# Patient Record
Sex: Female | Born: 1950 | Race: White | Hispanic: No | Marital: Married | Smoking: Never smoker
Health system: Southern US, Community
[De-identification: ages and names within clinical notes are randomized; demographics above are authoritative.]

## PROBLEM LIST (undated history)

## (undated) DIAGNOSIS — I10 Essential (primary) hypertension: Secondary | ICD-10-CM

## (undated) DIAGNOSIS — E7800 Pure hypercholesterolemia, unspecified: Secondary | ICD-10-CM

## (undated) DIAGNOSIS — E079 Disorder of thyroid, unspecified: Secondary | ICD-10-CM

## (undated) DIAGNOSIS — M1990 Unspecified osteoarthritis, unspecified site: Secondary | ICD-10-CM

## (undated) DIAGNOSIS — K589 Irritable bowel syndrome without diarrhea: Secondary | ICD-10-CM

## (undated) DIAGNOSIS — G4730 Sleep apnea, unspecified: Secondary | ICD-10-CM

## (undated) DIAGNOSIS — F419 Anxiety disorder, unspecified: Secondary | ICD-10-CM

## (undated) DIAGNOSIS — K219 Gastro-esophageal reflux disease without esophagitis: Secondary | ICD-10-CM

## (undated) HISTORY — DX: Sleep apnea, unspecified: G47.30

## (undated) HISTORY — PX: ABDOMINAL HYSTERECTOMY: SHX81

## (undated) HISTORY — PX: DILATION AND CURETTAGE OF UTERUS: SHX78

## (undated) HISTORY — PX: BACK SURGERY: SHX140

## (undated) HISTORY — PX: TONSILLECTOMY: SUR1361

## (undated) HISTORY — PX: BUNIONECTOMY: SHX129

---

## 2001-10-25 HISTORY — PX: NECK SURGERY: SHX720

## 2005-02-18 ENCOUNTER — Ambulatory Visit: Payer: Self-pay | Admitting: Internal Medicine

## 2005-05-25 ENCOUNTER — Ambulatory Visit: Payer: Self-pay

## 2005-07-07 ENCOUNTER — Ambulatory Visit: Payer: Self-pay | Admitting: Anesthesiology

## 2005-09-02 ENCOUNTER — Ambulatory Visit (HOSPITAL_COMMUNITY): Admission: RE | Admit: 2005-09-02 | Discharge: 2005-09-03 | Payer: Self-pay | Admitting: Neurosurgery

## 2006-09-08 ENCOUNTER — Ambulatory Visit: Payer: Self-pay | Admitting: Internal Medicine

## 2008-04-25 ENCOUNTER — Ambulatory Visit: Payer: Self-pay | Admitting: Internal Medicine

## 2008-05-01 ENCOUNTER — Ambulatory Visit: Payer: Self-pay | Admitting: Internal Medicine

## 2008-12-18 ENCOUNTER — Ambulatory Visit: Payer: Self-pay | Admitting: Internal Medicine

## 2009-11-25 ENCOUNTER — Ambulatory Visit: Payer: Self-pay | Admitting: Internal Medicine

## 2010-05-21 ENCOUNTER — Ambulatory Visit: Payer: Self-pay | Admitting: Gastroenterology

## 2010-07-22 ENCOUNTER — Ambulatory Visit: Payer: Self-pay | Admitting: Internal Medicine

## 2010-12-07 ENCOUNTER — Ambulatory Visit: Payer: Self-pay | Admitting: Internal Medicine

## 2011-05-27 ENCOUNTER — Ambulatory Visit: Payer: Self-pay | Admitting: Podiatry

## 2011-06-11 ENCOUNTER — Ambulatory Visit: Payer: Self-pay | Admitting: Podiatry

## 2012-03-16 ENCOUNTER — Ambulatory Visit: Payer: Self-pay | Admitting: Internal Medicine

## 2013-02-05 ENCOUNTER — Ambulatory Visit: Payer: Self-pay

## 2013-02-08 ENCOUNTER — Ambulatory Visit: Payer: Self-pay | Admitting: Podiatry

## 2013-03-20 ENCOUNTER — Ambulatory Visit: Payer: Self-pay | Admitting: Internal Medicine

## 2013-08-27 ENCOUNTER — Ambulatory Visit: Payer: Self-pay | Admitting: Unknown Physician Specialty

## 2014-01-04 ENCOUNTER — Ambulatory Visit: Payer: Self-pay | Admitting: Internal Medicine

## 2014-04-24 ENCOUNTER — Ambulatory Visit: Payer: Self-pay | Admitting: Internal Medicine

## 2014-05-06 ENCOUNTER — Ambulatory Visit: Payer: Self-pay | Admitting: Gastroenterology

## 2014-06-19 ENCOUNTER — Ambulatory Visit: Payer: Self-pay | Admitting: Gastroenterology

## 2015-01-17 ENCOUNTER — Ambulatory Visit: Payer: Self-pay | Admitting: Specialist

## 2015-03-19 ENCOUNTER — Other Ambulatory Visit: Payer: Self-pay | Admitting: Internal Medicine

## 2015-03-19 DIAGNOSIS — Z1231 Encounter for screening mammogram for malignant neoplasm of breast: Secondary | ICD-10-CM

## 2015-04-29 ENCOUNTER — Ambulatory Visit: Payer: Self-pay

## 2015-04-30 ENCOUNTER — Ambulatory Visit
Admission: RE | Admit: 2015-04-30 | Discharge: 2015-04-30 | Disposition: A | Discharge status: Home / Self Care | Payer: BC Managed Care – PPO | Source: Ambulatory Visit | Attending: Internal Medicine | Admitting: Internal Medicine

## 2015-04-30 DIAGNOSIS — Z1231 Encounter for screening mammogram for malignant neoplasm of breast: Secondary | ICD-10-CM | POA: Diagnosis present

## 2015-08-22 ENCOUNTER — Emergency Department
Admission: EM | Admit: 2015-08-22 | Discharge: 2015-08-22 | Disposition: A | Discharge status: Home / Self Care | Payer: BC Managed Care – PPO | Attending: Emergency Medicine | Admitting: Emergency Medicine

## 2015-08-22 ENCOUNTER — Emergency Department: Payer: BC Managed Care – PPO

## 2015-08-22 ENCOUNTER — Encounter: Payer: Self-pay | Admitting: Emergency Medicine

## 2015-08-22 DIAGNOSIS — K529 Noninfective gastroenteritis and colitis, unspecified: Secondary | ICD-10-CM

## 2015-08-22 DIAGNOSIS — I10 Essential (primary) hypertension: Secondary | ICD-10-CM | POA: Diagnosis not present

## 2015-08-22 DIAGNOSIS — R1030 Lower abdominal pain, unspecified: Secondary | ICD-10-CM | POA: Diagnosis present

## 2015-08-22 HISTORY — DX: Pure hypercholesterolemia, unspecified: E78.00

## 2015-08-22 HISTORY — DX: Essential (primary) hypertension: I10

## 2015-08-22 HISTORY — DX: Unspecified osteoarthritis, unspecified site: M19.90

## 2015-08-22 HISTORY — DX: Disorder of thyroid, unspecified: E07.9

## 2015-08-22 LAB — URINALYSIS COMPLETE WITH MICROSCOPIC (ARMC ONLY)
BILIRUBIN URINE: NEGATIVE
Glucose, UA: NEGATIVE mg/dL
Hgb urine dipstick: NEGATIVE
Ketones, ur: NEGATIVE mg/dL
Nitrite: NEGATIVE
PH: 5 (ref 5.0–8.0)
Protein, ur: 30 mg/dL — AB
SPECIFIC GRAVITY, URINE: 1.018 (ref 1.005–1.030)

## 2015-08-22 LAB — CBC WITH DIFFERENTIAL/PLATELET
BASOS ABS: 0.1 10*3/uL (ref 0–0.1)
Basophils Relative: 1 %
Eosinophils Absolute: 0.1 10*3/uL (ref 0–0.7)
Eosinophils Relative: 1 %
HEMATOCRIT: 43.5 % (ref 35.0–47.0)
Hemoglobin: 15 g/dL (ref 12.0–16.0)
LYMPHS PCT: 14 %
Lymphs Abs: 2 10*3/uL (ref 1.0–3.6)
MCH: 31.7 pg (ref 26.0–34.0)
MCHC: 34.5 g/dL (ref 32.0–36.0)
MCV: 91.8 fL (ref 80.0–100.0)
Monocytes Absolute: 0.9 10*3/uL (ref 0.2–0.9)
Monocytes Relative: 6 %
Neutro Abs: 11.2 10*3/uL — ABNORMAL HIGH (ref 1.4–6.5)
Neutrophils Relative %: 78 %
Platelets: 196 10*3/uL (ref 150–440)
RBC: 4.74 MIL/uL (ref 3.80–5.20)
RDW: 13.1 % (ref 11.5–14.5)
WBC: 14.4 10*3/uL — ABNORMAL HIGH (ref 3.6–11.0)

## 2015-08-22 LAB — COMPREHENSIVE METABOLIC PANEL
ALT: 35 U/L (ref 14–54)
ANION GAP: 11 (ref 5–15)
AST: 39 U/L (ref 15–41)
Albumin: 4.3 g/dL (ref 3.5–5.0)
Alkaline Phosphatase: 74 U/L (ref 38–126)
BUN: 16 mg/dL (ref 6–20)
CO2: 22 mmol/L (ref 22–32)
Calcium: 9.6 mg/dL (ref 8.9–10.3)
Chloride: 102 mmol/L (ref 101–111)
Creatinine, Ser: 0.75 mg/dL (ref 0.44–1.00)
GFR calc Af Amer: >60 mL/min (ref >60)
GFR calc non Af Amer: >60 mL/min (ref >60)
Glucose, Bld: 147 mg/dL — ABNORMAL HIGH (ref 65–99)
Potassium: 4.1 mmol/L (ref 3.5–5.1)
Sodium: 135 mmol/L (ref 135–145)
Total Bilirubin: 1.1 mg/dL (ref 0.3–1.2)
Total Protein: 7.8 g/dL (ref 6.5–8.1)

## 2015-08-22 LAB — LIPASE, BLOOD: Lipase: 25 U/L (ref 11–51)

## 2015-08-22 MED ORDER — CIPROFLOXACIN HCL 500 MG PO TABS
500.0000 mg | ORAL_TABLET | Freq: Once | ORAL | Status: AC
  Administered 2015-08-22: 500 mg via ORAL
  Filled 2015-08-22: qty 1

## 2015-08-22 MED ORDER — CIPROFLOXACIN HCL 500 MG PO TABS: 500.0000 mg | ORAL_TABLET | Freq: Two times a day (BID) | ORAL | Status: DC

## 2015-08-22 MED ORDER — METRONIDAZOLE 500 MG PO TABS: 500.0000 mg | ORAL_TABLET | Freq: Three times a day (TID) | ORAL | Status: AC

## 2015-08-22 MED ORDER — OXYCODONE-ACETAMINOPHEN 5-325 MG PO TABS: 1.0000 | ORAL_TABLET | Freq: Four times a day (QID) | ORAL | Status: DC | PRN

## 2015-08-22 MED ORDER — ONDANSETRON HCL 4 MG/2ML IJ SOLN
4.0000 mg | Freq: Once | INTRAMUSCULAR | Status: AC
  Administered 2015-08-22: 4 mg via INTRAVENOUS
  Filled 2015-08-22: qty 2

## 2015-08-22 MED ORDER — SODIUM CHLORIDE 0.9 % IV BOLUS (SEPSIS)
1000.0000 mL | Freq: Once | INTRAVENOUS | Status: AC
  Administered 2015-08-22: 1000 mL via INTRAVENOUS

## 2015-08-22 MED ORDER — IOHEXOL 240 MG/ML SOLN
25.0000 mL | Freq: Once | INTRAMUSCULAR | Status: AC | PRN
  Administered 2015-08-22: 25 mL via ORAL

## 2015-08-22 MED ORDER — MORPHINE SULFATE (PF) 4 MG/ML IV SOLN
4.0000 mg | Freq: Once | INTRAVENOUS | Status: AC
  Administered 2015-08-22: 4 mg via INTRAVENOUS
  Filled 2015-08-22: qty 1

## 2015-08-22 MED ORDER — IOHEXOL 300 MG/ML  SOLN
100.0000 mL | Freq: Once | INTRAMUSCULAR | Status: AC | PRN
  Administered 2015-08-22: 100 mL via INTRAVENOUS

## 2015-08-22 MED ORDER — METRONIDAZOLE 500 MG PO TABS
500.0000 mg | ORAL_TABLET | Freq: Once | ORAL | Status: AC
  Administered 2015-08-22: 500 mg via ORAL
  Filled 2015-08-22: qty 1

## 2015-08-22 NOTE — ED Provider Notes (Addendum)
[facility] Emergency Department Provider Note  ____________________________________________  Time seen: Approximately 9 PM  I have reviewed the triage vital signs and the nursing notes.   HISTORY  Chief Complaint Abdominal Pain and Rectal Bleeding    HPI Valerie Solis is a 64 y.o. female with a history of diverticulosis who is presenting today with lower abdominal cramping with multiple episodes of diarrhea. She says that about 4:30 this morning she began to have a "bubbling" in her stomach. She then began to have intermittent cramping abdominal pain. She says she has had 7-8 episodes of diarrhea the last 3 of which were completely blood. She describes the blood as bright red. Said that she had had some bleeding in the past but it was attributed to a hemorrhoid. However, it was not to the amount that it has been today.She denies any recent antibiotics or hospitalizations. Denies any chest pain or shortness of breath. Does admit to nausea but no vomiting. Said she took one Dulcolax 24 hours ago.   Past Medical History  Diagnosis Date  . Hypertension   . Thyroid disease   . Hypercholesterolemia   . Arthritis     There are no active problems to display for this patient.   Past Surgical History  Procedure Laterality Date  . Abdominal hysterectomy    . Back surgery      No current outpatient prescriptions on file.  Allergies Ace inhibitors and Sulfa antibiotics  Family History  Problem Relation Age of Onset  . Breast cancer Maternal Aunt [id number]    Social History Social History  Substance Use Topics  . Smoking status: Never Smoker   . Smokeless tobacco: None  . Alcohol Use: No    Review of Systems Constitutional: No fever/chills Eyes: No visual changes. ENT: No sore throat. Cardiovascular: Denies chest pain. Respiratory: Denies shortness of breath. Gastrointestinal: no vomiting.  No constipation. Genitourinary: Negative for  dysuria. Musculoskeletal: Negative for back pain. Skin: Negative for rash. Neurological: Negative for headaches, focal weakness or numbness.  10-point ROS otherwise negative.  ____________________________________________   PHYSICAL EXAM:  VITAL SIGNS: ED Triage Vitals  Enc Vitals Group     BP 08/22/15 1615 127/55 mmHg     Pulse Rate 08/22/15 1615 102     Resp 08/22/15 1615 20     Temp 08/22/15 1615 99.5 F (37.5 C)     Temp Source 08/22/15 1615 Oral     SpO2 08/22/15 1615 98 %     Weight 08/22/15 1615 217 lb (98.431 kg)     Height 08/22/15 1615 5\' 6"  (1.676 m)     Head Cir --      Peak Flow --      Pain Score 08/22/15 1616 6     Pain Loc --      Pain Edu? --      Excl. in GC? --     Constitutional: Alert and oriented. Well appearing and in no acute distress. Eyes: Conjunctivae are normal. PERRL. EOMI. Head: Atraumatic. Nose: No congestion/rhinnorhea. Mouth/Throat: Mucous membranes are moist.  Oropharynx non-erythematous. Neck: No stridor.   Cardiovascular: Normal rate, regular rhythm. Grossly normal heart sounds.  Good peripheral circulation. Respiratory: Normal respiratory effort.  No retractions. Lungs CTAB. Gastrointestinal: Soft with suprapubic and left lower quadrant tenderness to palpation. There is no rebound or guarding.. No distention. No abdominal bruits. No CVA tenderness. Rectal exam without any external hemorrhoids. Small amount of bright red blood streaks on my glove on internal  exam. Musculoskeletal: No lower extremity tenderness nor edema.  No joint effusions. Neurologic:  Normal speech and language. No gross focal neurologic deficits are appreciated. No gait instability. Skin:  Skin is warm, dry and intact. No rash noted. Psychiatric: Mood and affect are normal. Speech and behavior are normal.  ____________________________________________   LABS (all labs ordered are listed, but only abnormal results are displayed)  Labs Reviewed  COMPREHENSIVE  METABOLIC PANEL - Abnormal; Notable for the following:    Glucose, Bld 147 (*)    All other components within normal limits  URINALYSIS COMPLETEWITH MICROSCOPIC ([facility] ONLY) - Abnormal; Notable for the following:    Color, Urine YELLOW (*)    APPearance CLEAR (*)    Protein, ur 30 (*)    Leukocytes, UA TRACE (*)    Bacteria, UA RARE (*)    Squamous Epithelial / LPF 0-5 (*)    All other components within normal limits  CBC WITH DIFFERENTIAL/PLATELET - Abnormal; Notable for the following:    WBC 14.4 (*)    Neutro Abs 11.2 (*)    All other components within normal limits  LIPASE, BLOOD  CBC WITH DIFFERENTIAL/PLATELET   ____________________________________________  EKG   ____________________________________________  RADIOLOGY  Patient with uncomplicated colitis on her CAT scan her abdomen and pelvis. ____________________________________________   PROCEDURES   ____________________________________________   INITIAL IMPRESSION / ASSESSMENT AND PLAN / ED COURSE  Pertinent labs & imaging results that were available during my care of the patient were reviewed by me and considered in my medical decision making (see chart for details).  ----------------------------------------- 11:13 PM on 08/22/2015 -----------------------------------------  Patient with improved symptoms after morphine and Zofran. Discussed the CAT scan as well as lab findings with the patient. She again denies any antibiotics recently, any travel, camping. Unclear cause of her colitis at this time. However, will discharge with antibiotics and pain medications. Patient will follow up with her primary care doctor.knows to Return for any worsening or concerning symptoms. ____________________________________________   FINAL CLINICAL IMPRESSION(S) / ED DIAGNOSES  Acute colitis.    Myrna Blazer, MD 08/22/15 2313  Furthermore, the patient at the time of discharge is not had any further episodes  of diarrhea while in the treatment area.  Myrna Blazer, MD 08/22/15 [phone]

## 2015-08-22 NOTE — ED Notes (Signed)
Patient presents to the ED after multiple episodes of diarrhea, lower abdominal pain and most recently a couple episodes of stool with bright red blood.  Patient reports knowledge of a hemorrhoid.  Patient reports a history of diverticulitis.  Patient states she took dulcolax yesterday due to constipation.  Patient denies vomiting.  Patient ambulatory to triage without obvious distress.

## 2015-08-22 NOTE — ED Notes (Signed)
Pt dc to home w/ spouse. Pt ambulatory and a/o x4.

## 2015-08-22 NOTE — ED Notes (Signed)
Patient transported to CT 

## 2015-08-22 NOTE — Discharge Instructions (Signed)
°  Colitis °Colitis is inflammation of the colon. Colitis may last a short time (acute) or it may last a long time (chronic). °CAUSES °This condition may be caused by: °· Viruses. °· Bacteria. °· Reactions to medicine. °· Certain autoimmune diseases, such as Crohn disease or ulcerative colitis. °SYMPTOMS °Symptoms of this condition include: °· Diarrhea. °· Passing bloody or tarry stool. °· Pain. °· Fever. °· Vomiting. °· Tiredness (fatigue). °· Weight loss. °· Bloating. °· Sudden increase in abdominal pain. °· Having fewer bowel movements than usual. °DIAGNOSIS °This condition is diagnosed with a stool test or a blood test. You may also have other tests, including X-rays, a CT scan, or a colonoscopy. °TREATMENT °Treatment may include: °· Resting the bowel. This involves not eating or drinking for a period of time. °· Fluids that are given through an IV tube. °· Medicine for pain and diarrhea. °· Antibiotic medicines. °· Cortisone medicines. °· Surgery. °HOME CARE INSTRUCTIONS °Eating and Drinking °· Follow instructions from your health care provider about eating or drinking restrictions. °· Drink enough fluid to keep your urine clear or pale yellow. °· Work with a dietitian to determine which foods cause your condition to flare up. °· Avoid foods that cause flare-ups. °· Eat a well-balanced diet. °Medicines °· Take over-the-counter and prescription medicines only as told by your health care provider. °· If you were prescribed an antibiotic medicine, take it as told by your health care provider. Do not stop taking the antibiotic even if you start to feel better. °General Instructions °· Keep all follow-up visits as told by your health care provider. This is important. °SEEK MEDICAL CARE IF: °· Your symptoms do not go away. °· You develop new symptoms. °SEEK IMMEDIATE MEDICAL CARE IF: °· You have a fever that does not go away with treatment. °· You develop chills. °· You have extreme weakness, fainting, or  dehydration. °· You have repeated vomiting. °· You develop severe pain in your abdomen. °· You pass bloody or tarry stool. °  °This information is not intended to replace advice given to you by your health care provider. Make sure you discuss any questions you have with your health care provider. °  °Document Released: 11/18/2004 Document Revised: 07/02/2015 Document Reviewed: 02/03/2015 °Elsevier Interactive Patient Education ©2016 [employer] ° °

## 2016-03-26 ENCOUNTER — Other Ambulatory Visit
Admission: RE | Admit: 2016-03-26 | Discharge: 2016-03-26 | Disposition: A | Discharge status: Home / Self Care | Payer: BC Managed Care – PPO | Source: Ambulatory Visit | Attending: Unknown Physician Specialty | Admitting: Unknown Physician Specialty

## 2016-03-26 DIAGNOSIS — M25562 Pain in left knee: Secondary | ICD-10-CM | POA: Insufficient documentation

## 2016-03-26 LAB — SYNOVIAL CELL COUNT + DIFF, W/ CRYSTALS
Crystals, Fluid: NONE SEEN
EOSINOPHILS-SYNOVIAL: 0 %
LYMPHOCYTES-SYNOVIAL FLD: 3 %
Monocyte-Macrophage-Synovial Fluid: 5 %
NEUTROPHIL, SYNOVIAL: 92 %
OTHER CELLS-SYN: 0
WBC, SYNOVIAL: 21527 /mm3 — AB (ref 0–200)

## 2016-03-30 ENCOUNTER — Other Ambulatory Visit: Payer: Self-pay | Admitting: Internal Medicine

## 2016-03-30 DIAGNOSIS — Z1231 Encounter for screening mammogram for malignant neoplasm of breast: Secondary | ICD-10-CM

## 2016-03-31 LAB — BODY FLUID CULTURE: CULTURE: NO GROWTH

## 2016-04-30 ENCOUNTER — Ambulatory Visit: Payer: BC Managed Care – PPO

## 2016-05-27 ENCOUNTER — Ambulatory Visit: Payer: BC Managed Care – PPO

## 2016-06-09 ENCOUNTER — Other Ambulatory Visit: Payer: Self-pay | Admitting: Internal Medicine

## 2016-06-09 ENCOUNTER — Ambulatory Visit
Admission: RE | Admit: 2016-06-09 | Discharge: 2016-06-09 | Disposition: A | Discharge status: Home / Self Care | Payer: BC Managed Care – PPO | Source: Ambulatory Visit | Attending: Internal Medicine | Admitting: Internal Medicine

## 2016-06-09 DIAGNOSIS — Z1231 Encounter for screening mammogram for malignant neoplasm of breast: Secondary | ICD-10-CM | POA: Diagnosis present

## 2016-10-21 ENCOUNTER — Encounter: Payer: Self-pay | Admitting: *Deleted

## 2016-10-21 ENCOUNTER — Encounter: Payer: BC Managed Care – PPO | Attending: Internal Medicine | Admitting: *Deleted

## 2016-10-21 VITALS — BP 110/68 | Ht 66.0 in | Wt 222.6 lb

## 2016-10-21 DIAGNOSIS — Z713 Dietary counseling and surveillance: Secondary | ICD-10-CM | POA: Diagnosis not present

## 2016-10-21 DIAGNOSIS — E119 Type 2 diabetes mellitus without complications: Secondary | ICD-10-CM

## 2016-10-21 NOTE — Progress Notes (Signed)
Diabetes Self-Management Education  Visit Type: First/Initial  Appt. Start Time: 1545 Appt. End Time: 1710  10/21/2016  [name], identified by name and date of birth, is a 65 y.o. female with a diagnosis of Diabetes: Type 2.   ASSESSMENT  Blood pressure 110/68, height 5\' 6"  (1.676 m), weight 222 lb 9.6 oz (101 kg). Body mass index is 35.93 kg/m.      Diabetes Self-Management Education - 10/21/16 1735      Visit Information   Visit Type First/Initial     Initial Visit   Diabetes Type Type 2   Are you currently following a meal plan? No   Are you taking your medications as prescribed? Yes   Date Diagnosed 3 weeks ago     Health Coping   How would you rate your overall health? Good     Psychosocial Assessment   Patient Belief/Attitude about Diabetes Motivated to manage diabetes  "sad"   Self-care barriers None   Self-management support Doctor's office;Family   Patient Concerns Nutrition/Meal planning;Medication;Monitoring;Healthy Lifestyle;Glycemic Control;Weight Control   Special Needs None   Preferred Learning Style Auditory;Visual;Hands on   Learning Readiness Ready   How often do you need to have someone help you when you read instructions, pamphlets, or other written materials from your doctor or pharmacy? 1 - Never   What is the last grade level you completed in school? Masters     Pre-Education Assessment   Patient understands the diabetes disease and treatment process. Needs Instruction   Patient understands incorporating nutritional management into lifestyle. Needs Instruction   Patient undertands incorporating physical activity into lifestyle. Needs Review   Patient understands using medications safely. Needs Instruction   Patient understands monitoring blood glucose, interpreting and using results Needs Instruction   Patient understands prevention, detection, and treatment of acute complications. Needs Instruction   Patient understands  prevention, detection, and treatment of chronic complications. Needs Instruction   Patient understands how to develop strategies to address psychosocial issues. Needs Instruction   Patient understands how to develop strategies to promote health/change behavior. Needs Instruction     Complications   Last HgB A1C per patient/outside source 6.9 %  09/20/16   How often do you check your blood sugar? 0 times/day (not testing)  Provided Contour Next One meter and instructed on use. BG upon return demonstration was 176 mg/dL at 1:614:55 pm - 3 hrs pp. (Had cereal and milk)   Have you had a dilated eye exam in the past 12 months? No   Have you had a dental exam in the past 12 months? Yes   Are you checking your feet? No     Dietary Intake   Breakfast breakfast burrito and 2 chocolate milks from [employer]; cereal and milk; bagel   Snack (morning) wheat thins   Lunch sandwich; lunchables with fruit, cheese   Snack (afternoon) lite yogurt   Dinner grilled [place]key burger, pork chop, flank steak with sweet potato, peas, green beans, corn, pasta, rice   Beverage(s) juice, power aid zero, diet soda     Exercise   Exercise Type Light (walking / raking leaves)   How many days per week to you exercise? 7   How many minutes per day do you exercise? 20   Total minutes per week of exercise 140     Patient Education   Previous Diabetes Education No   Disease state  Definition of diabetes, type 1 and 2, and the diagnosis of diabetes;Factors that contribute to  the development of diabetes   Nutrition management  Role of diet in the treatment of diabetes and the relationship between the three main macronutrients and blood glucose level;Carbohydrate counting;Reviewed blood glucose goals for pre and post meals and how to evaluate the patients' food intake on their blood glucose level.   Physical activity and exercise  Role of exercise on diabetes management, blood pressure control and cardiac health.   Medications  Reviewed patients medication for diabetes, action, purpose, timing of dose and side effects.   Monitoring Taught/evaluated SMBG meter.;Purpose and frequency of SMBG.;Taught/discussed recording of test results and interpretation of SMBG.;Identified appropriate SMBG and/or A1C goals.   Chronic complications Relationship between chronic complications and blood glucose control;Retinopathy and reason for yearly dilated eye exams   Psychosocial adjustment Identified and addressed patients feelings and concerns about diabetes     Individualized Goals (developed by patient)   Reducing Risk Improve blood sugars Decrease medications Prevent diabetes complications Lose weight Lead a healthier lifestyle     Outcomes   Expected Outcomes Demonstrated interest in learning. Expect positive outcomes   Future DMSE 2 wks      Individualized Plan for Diabetes Self-Management Training:   Learning Objective:  Patient will have a greater understanding of diabetes self-management. Patient education plan is to attend individual and/or group sessions per assessed needs and concerns.   Plan:   Patient Instructions  Check blood sugars 1 x day before breakfast or 2 hrs after supper every day Exercise: Continue walking for  20  minutes  7 days a week and gradually increase to 150 minutes/week Eat 3 meals day, 2  snacks a day Space meals 4-6 hours apart Avoid sugar sweetened drinks (juices) Make an eye doctor appointment Bring blood sugar records to the next class Call your doctor for a prescription for:  1. Meter strips (type) Contour Next   checking  1     times per day  2. Lancets (type) Contour Microlet checking  1     times per day  Expected Outcomes:  Demonstrated interest in learning. Expect positive outcomes  Education material provided:  General Meal Planning Guidelines Simple Meal Plan Meter - Bayer Contour Next One  If problems or questions, patient to contact team via:  Sharion SettlerSheila Cooper Stamp, RN,  CCM, CDE [phone]  Future DSME appointment: 2 wks  Monday November 01, 2016 for Diabetes Class 1

## 2016-10-21 NOTE — Patient Instructions (Signed)
Check blood sugars 1 x day before breakfast or 2 hrs after supper every day  Exercise: Continue walking for  20  minutes  7 days a week and gradually increase to 150 minutes/week  Eat 3 meals day, 2  snacks a day Space meals 4-6 hours apart Avoid sugar sweetened drinks (juices)  Make an eye doctor appointment  Bring blood sugar records to the next class  Call your doctor for a prescription for:  1. Meter strips (type) Contour Next   checking  1     times per day  2. Lancets (type) Contour Microlet checking  1     times per day  Return for classes on:

## 2016-11-01 ENCOUNTER — Ambulatory Visit: Payer: BC Managed Care – PPO

## 2016-11-08 ENCOUNTER — Ambulatory Visit: Payer: BC Managed Care – PPO

## 2016-11-08 ENCOUNTER — Encounter: Payer: Self-pay | Admitting: *Deleted

## 2016-11-08 NOTE — Progress Notes (Signed)
Pt came by at 8:00 am today for meter instruction. She had to obtain a new meter due to insurance. Instructed her on One Touch Ultra 2 meter. Blood sugar upon return demonstration was 225 mg/dL at 9:608:15 am - fasting. Pt reported that Metformin caused her stomach to be upset (history of IBS) and she is not taking. She reports that she sent Dr Graciela HusbandsKlein a note and is waiting to hear back from his office.

## 2016-11-15 ENCOUNTER — Ambulatory Visit: Payer: BC Managed Care – PPO

## 2016-11-29 ENCOUNTER — Ambulatory Visit: Payer: BC Managed Care – PPO

## 2016-11-30 ENCOUNTER — Telehealth: Payer: Self-pay | Admitting: Dietician

## 2016-11-30 NOTE — Telephone Encounter (Signed)
Returned message from patient that she was unable to attend class 1 yesterday 11/29/16 due to being ill with flu. Left message for her to call back and confirm if she would like to come to next evening series, beginning 12/27/16.

## 2016-12-03 ENCOUNTER — Telehealth: Payer: Self-pay | Admitting: Dietician

## 2016-12-03 NOTE — Telephone Encounter (Signed)
Returned voicemail message from patient that she would like to return for class 1 on 12/27/16 and complete that series. Left a message to confirm that she will be added to that class.

## 2016-12-06 ENCOUNTER — Ambulatory Visit: Payer: BC Managed Care – PPO

## 2016-12-13 ENCOUNTER — Ambulatory Visit: Payer: BC Managed Care – PPO

## 2016-12-27 ENCOUNTER — Encounter: Payer: BC Managed Care – PPO | Attending: Internal Medicine | Admitting: Dietician

## 2016-12-27 ENCOUNTER — Encounter: Payer: Self-pay | Admitting: Dietician

## 2016-12-27 VITALS — Ht 66.0 in | Wt 220.3 lb

## 2016-12-27 DIAGNOSIS — E119 Type 2 diabetes mellitus without complications: Secondary | ICD-10-CM | POA: Insufficient documentation

## 2016-12-27 DIAGNOSIS — Z713 Dietary counseling and surveillance: Secondary | ICD-10-CM | POA: Insufficient documentation

## 2016-12-27 NOTE — Progress Notes (Signed)

## 2017-01-17 ENCOUNTER — Ambulatory Visit: Payer: BC Managed Care – PPO

## 2017-01-18 ENCOUNTER — Telehealth: Payer: Self-pay | Admitting: *Deleted

## 2017-01-18 NOTE — Telephone Encounter (Signed)
Patient left a message on main number last night that she needed to reschedule Classes 2 and 3. Called and left her a message of upcoming dates in April and May. Asked her to call back and confirm.

## 2017-03-03 ENCOUNTER — Encounter: Payer: Self-pay | Admitting: *Deleted

## 2017-05-18 ENCOUNTER — Other Ambulatory Visit: Payer: Self-pay | Admitting: Internal Medicine

## 2017-05-18 DIAGNOSIS — Z1231 Encounter for screening mammogram for malignant neoplasm of breast: Secondary | ICD-10-CM

## 2017-06-15 ENCOUNTER — Inpatient Hospital Stay: Admission: RE | Admit: 2017-06-15 | Payer: BC Managed Care – PPO | Source: Ambulatory Visit

## 2017-07-01 ENCOUNTER — Other Ambulatory Visit: Payer: Self-pay | Admitting: Surgery

## 2017-07-01 DIAGNOSIS — M1712 Unilateral primary osteoarthritis, left knee: Secondary | ICD-10-CM

## 2017-07-08 ENCOUNTER — Ambulatory Visit: Payer: BC Managed Care – PPO

## 2017-07-11 ENCOUNTER — Ambulatory Visit
Admission: RE | Admit: 2017-07-11 | Discharge: 2017-07-11 | Disposition: A | Discharge status: Home / Self Care | Payer: BC Managed Care – PPO | Source: Ambulatory Visit | Attending: Surgery | Admitting: Surgery

## 2017-07-11 DIAGNOSIS — X58XXXA Exposure to other specified factors, initial encounter: Secondary | ICD-10-CM | POA: Insufficient documentation

## 2017-07-11 DIAGNOSIS — S83242A Other tear of medial meniscus, current injury, left knee, initial encounter: Secondary | ICD-10-CM | POA: Insufficient documentation

## 2017-07-11 DIAGNOSIS — M1712 Unilateral primary osteoarthritis, left knee: Secondary | ICD-10-CM

## 2017-07-27 ENCOUNTER — Ambulatory Visit: Payer: Medicare Other

## 2017-07-27 ENCOUNTER — Other Ambulatory Visit: Payer: Self-pay | Admitting: Surgery

## 2017-07-27 ENCOUNTER — Ambulatory Visit
Admission: RE | Admit: 2017-07-27 | Discharge: 2017-07-27 | Disposition: A | Discharge status: Home / Self Care | Payer: Medicare Other | Source: Ambulatory Visit | Attending: Surgery | Admitting: Surgery

## 2017-07-27 ENCOUNTER — Encounter
Admission: RE | Admit: 2017-07-27 | Discharge: 2017-07-27 | Disposition: A | Discharge status: Home / Self Care | Payer: Medicare Other | Source: Ambulatory Visit | Attending: Surgery | Admitting: Surgery

## 2017-07-27 DIAGNOSIS — Z01818 Encounter for other preprocedural examination: Secondary | ICD-10-CM

## 2017-07-27 DIAGNOSIS — F329 Major depressive disorder, single episode, unspecified: Secondary | ICD-10-CM | POA: Diagnosis not present

## 2017-07-27 DIAGNOSIS — I10 Essential (primary) hypertension: Secondary | ICD-10-CM | POA: Insufficient documentation

## 2017-07-27 DIAGNOSIS — M1712 Unilateral primary osteoarthritis, left knee: Secondary | ICD-10-CM | POA: Diagnosis not present

## 2017-07-27 DIAGNOSIS — Z7984 Long term (current) use of oral hypoglycemic drugs: Secondary | ICD-10-CM | POA: Diagnosis not present

## 2017-07-27 DIAGNOSIS — F419 Anxiety disorder, unspecified: Secondary | ICD-10-CM | POA: Diagnosis not present

## 2017-07-27 DIAGNOSIS — G4730 Sleep apnea, unspecified: Secondary | ICD-10-CM | POA: Diagnosis not present

## 2017-07-27 DIAGNOSIS — Z79899 Other long term (current) drug therapy: Secondary | ICD-10-CM | POA: Diagnosis not present

## 2017-07-27 DIAGNOSIS — K219 Gastro-esophageal reflux disease without esophagitis: Secondary | ICD-10-CM | POA: Diagnosis not present

## 2017-07-27 DIAGNOSIS — E785 Hyperlipidemia, unspecified: Secondary | ICD-10-CM | POA: Diagnosis not present

## 2017-07-27 HISTORY — DX: Irritable bowel syndrome, unspecified: K58.9

## 2017-07-27 HISTORY — DX: Anxiety disorder, unspecified: F41.9

## 2017-07-27 HISTORY — DX: Gastro-esophageal reflux disease without esophagitis: K21.9

## 2017-07-27 LAB — TYPE AND SCREEN
ABO/RH(D): A NEG
Antibody Screen: NEGATIVE

## 2017-07-27 LAB — BASIC METABOLIC PANEL
ANION GAP: 9 (ref 5–15)
BUN: 14 mg/dL (ref 6–20)
CALCIUM: 9.4 mg/dL (ref 8.9–10.3)
CO2: 25 mmol/L (ref 22–32)
Chloride: 104 mmol/L (ref 101–111)
Creatinine, Ser: 0.66 mg/dL (ref 0.44–1.00)
GFR calc Af Amer: >60 mL/min (ref >60)
GFR calc non Af Amer: >60 mL/min (ref >60)
GLUCOSE: 121 mg/dL — AB (ref 65–99)
Potassium: 4 mmol/L (ref 3.5–5.1)
Sodium: 138 mmol/L (ref 135–145)

## 2017-07-27 LAB — CBC
HEMATOCRIT: 39.5 % (ref 35.0–47.0)
Hemoglobin: 13.9 g/dL (ref 12.0–16.0)
MCH: 32.1 pg (ref 26.0–34.0)
MCHC: 35.2 g/dL (ref 32.0–36.0)
MCV: 91.2 fL (ref 80.0–100.0)
Platelets: 165 10*3/uL (ref 150–440)
RBC: 4.33 MIL/uL (ref 3.80–5.20)
RDW: 12.7 % (ref 11.5–14.5)
WBC: 5.4 10*3/uL (ref 3.6–11.0)

## 2017-07-27 LAB — URINALYSIS, COMPLETE (UACMP) WITH MICROSCOPIC
Bacteria, UA: NONE SEEN
Bilirubin Urine: NEGATIVE
GLUCOSE, UA: NEGATIVE mg/dL
Hgb urine dipstick: NEGATIVE
Ketones, ur: NEGATIVE mg/dL
Nitrite: NEGATIVE
PH: 6 (ref 5.0–8.0)
PROTEIN: NEGATIVE mg/dL
Specific Gravity, Urine: 1.006 (ref 1.005–1.030)

## 2017-07-27 LAB — PROTIME-INR
INR: 0.99
Prothrombin Time: 13 seconds (ref 11.4–15.2)

## 2017-07-27 LAB — SURGICAL PCR SCREEN
MRSA, PCR: POSITIVE — AB
Staphylococcus aureus: POSITIVE — AB

## 2017-07-27 NOTE — Patient Instructions (Signed)
Your procedure is scheduled on: August 11, 2017 (THURSDAY ) Report to Same Day Surgery 2nd floor medical mall (Medical Mall Entrance-take elevator on left to 2nd floor.  Check in with surgery information desk.) To find out your arrival time please call [phone] between 1PM - 3PM on August 10, 2017 [facility] )    Remember: Instructions that are not followed completely may result in serious medical risk, up to and including death, or upon the discretion of your surgeon and anesthesiologist your surgery may need to be rescheduled.    _x___ 1. Do not eat food after midnight the night before your procedure. You may drink clear liquids up to 2 hours before you are scheduled to arrive at the hospital for your procedure.  Do not drink clear liquids within 2 hours of your scheduled arrival to the hospital.  Clear liquids include  --Water or Apple juice without pulp  --Clear carbohydrate beverage such as ClearFast or Gatorade  --Black Coffee or Clear Tea (No milk, no creamers, do not add anything to                  the coffee or Tea Type 1 and type 2 diabetics should only drink water.  No gum chewing or hard candies.     __x__ 2. No Alcohol for 24 hours before or after surgery.   __x__3. No Smoking for 24 prior to surgery.   ____  4. Bring all medications with you on the day of surgery if instructed.    __x__ 5. Notify your doctor if there is any change in your medical condition     (cold, fever, infections).     Do not wear jewelry, make-up, hairpins, clips or nail polish.  Do not wear lotions, powders, or perfumes.  Do not shave 48 hours prior to surgery. Men may shave face and neck.  Do not bring valuables to the hospital.    [facility] is not responsible for any belongings or valuables.               Contacts, dentures or bridgework may not be worn into surgery.  Leave your suitcase in the car. After surgery it may be brought to your room.  For patients admitted to the  hospital, discharge time is determined by your  treatment team                      Patients discharged the day of surgery will not be allowed to drive home.  You will need someone to drive you home and stay with you the night of your procedure.    Please read over the following fact sheets that you were given:   [facility] Preparing for Surgery and or MRSA Information   TAKE THE FOLLOWING MEDICATIONS WITH A SIP OF WATER THE MORNING OF SURGERY :  1. CYMBALTA  2. FAMOTIDINE  3. LEVOTHYROXINE   ____Fleets enema or Magnesium Citrate as directed.   _x___ Use CHG Soap or sage wipes as directed on instruction sheet   _X___ Use inhalers on the day of surgery and bring to hospital day of surgery (USE ALBUTEROL AND SYMBICORT INHALERS THE MORNING OF SURGERY AND BRING TO HOSPITAL )  ____ Stop Metformin and Janumet 2 days prior to surgery.    ____ Take 1/2 of usual insulin dose the night before surgery and none on the morning surgery.      _x___ Follow recommendations from Cardiologist, Pulmonologist or PCP regarding  stopping Aspirin, Coumadin, Plavix ,Eliquis, Effient, or Pradaxa, and Pletal.  X____Stop Anti-inflammatories such as Advil, Aleve, Ibuprofen, Motrin, Naproxen, Naprosyn, Goodies powders or aspirin products. OK to take Tylenol                            _x___ Stop supplements until after surgery.  But may continue Vitamin D, Vitamin B and multivitamin (STOP FISH OIL AND CO Q 10 NOW )        __X__ Bring C-Pap to the hospital.

## 2017-07-27 NOTE — Pre-Procedure Instructions (Signed)
Positive   MRSA/STAPH  results faxed to Dr. Joice Lofts office.

## 2017-07-28 ENCOUNTER — Ambulatory Visit
Admission: RE | Admit: 2017-07-28 | Discharge: 2017-07-28 | Disposition: A | Discharge status: Home / Self Care | Payer: Medicare Other | Source: Ambulatory Visit | Attending: Surgery | Admitting: Surgery

## 2017-07-28 ENCOUNTER — Other Ambulatory Visit: Payer: Self-pay | Admitting: Surgery

## 2017-07-28 DIAGNOSIS — Z01818 Encounter for other preprocedural examination: Secondary | ICD-10-CM | POA: Diagnosis present

## 2017-07-28 DIAGNOSIS — Z888 Allergy status to other drugs, medicaments and biological substances status: Secondary | ICD-10-CM | POA: Insufficient documentation

## 2017-07-28 DIAGNOSIS — Z882 Allergy status to sulfonamides status: Secondary | ICD-10-CM | POA: Diagnosis not present

## 2017-07-28 DIAGNOSIS — M1712 Unilateral primary osteoarthritis, left knee: Secondary | ICD-10-CM | POA: Insufficient documentation

## 2017-07-28 DIAGNOSIS — I700 Atherosclerosis of aorta: Secondary | ICD-10-CM | POA: Insufficient documentation

## 2017-07-28 DIAGNOSIS — I10 Essential (primary) hypertension: Secondary | ICD-10-CM

## 2017-07-28 LAB — URINE CULTURE

## 2017-07-29 NOTE — Pre-Procedure Instructions (Signed)
Urine Culture results sent to Dr. Joice Lofts for review.

## 2017-08-09 ENCOUNTER — Ambulatory Visit
Admission: RE | Admit: 2017-08-09 | Discharge: 2017-08-09 | Disposition: A | Discharge status: Home / Self Care | Payer: Medicare Other | Source: Ambulatory Visit | Attending: Internal Medicine | Admitting: Internal Medicine

## 2017-08-09 DIAGNOSIS — Z1231 Encounter for screening mammogram for malignant neoplasm of breast: Secondary | ICD-10-CM | POA: Insufficient documentation

## 2017-08-09 DIAGNOSIS — M1712 Unilateral primary osteoarthritis, left knee: Secondary | ICD-10-CM | POA: Diagnosis present

## 2017-08-10 MED ORDER — VANCOMYCIN HCL IN DEXTROSE 1-5 GM/200ML-% IV SOLN
1000.0000 mg | Freq: Once | INTRAVENOUS | Status: AC
  Administered 2017-08-11: 1000 mg via INTRAVENOUS

## 2017-08-11 ENCOUNTER — Inpatient Hospital Stay: Payer: Medicare Other | Admitting: Anesthesiology

## 2017-08-11 ENCOUNTER — Inpatient Hospital Stay
Admission: RE | Admit: 2017-08-11 | Discharge: 2017-08-15 | DRG: 469 | Disposition: A | Discharge status: Home Health | Payer: Medicare Other | Source: Ambulatory Visit | Attending: Internal Medicine | Admitting: Internal Medicine

## 2017-08-11 ENCOUNTER — Encounter
Admission: RE | Disposition: A | Discharge status: Home Health | Payer: Self-pay | Source: Ambulatory Visit | Attending: Surgery

## 2017-08-11 ENCOUNTER — Encounter: Payer: Self-pay | Admitting: Anesthesiology

## 2017-08-11 ENCOUNTER — Inpatient Hospital Stay: Payer: Medicare Other

## 2017-08-11 DIAGNOSIS — Z823 Family history of stroke: Secondary | ICD-10-CM

## 2017-08-11 DIAGNOSIS — I10 Essential (primary) hypertension: Secondary | ICD-10-CM | POA: Diagnosis present

## 2017-08-11 DIAGNOSIS — K219 Gastro-esophageal reflux disease without esophagitis: Secondary | ICD-10-CM | POA: Diagnosis present

## 2017-08-11 DIAGNOSIS — G4733 Obstructive sleep apnea (adult) (pediatric): Secondary | ICD-10-CM | POA: Diagnosis present

## 2017-08-11 DIAGNOSIS — J9601 Acute respiratory failure with hypoxia: Secondary | ICD-10-CM | POA: Diagnosis not present

## 2017-08-11 DIAGNOSIS — Z6835 Body mass index (BMI) 35.0-35.9, adult: Secondary | ICD-10-CM | POA: Diagnosis not present

## 2017-08-11 DIAGNOSIS — M1712 Unilateral primary osteoarthritis, left knee: Principal | ICD-10-CM | POA: Diagnosis present

## 2017-08-11 DIAGNOSIS — Z7951 Long term (current) use of inhaled steroids: Secondary | ICD-10-CM | POA: Diagnosis not present

## 2017-08-11 DIAGNOSIS — Z1231 Encounter for screening mammogram for malignant neoplasm of breast: Secondary | ICD-10-CM

## 2017-08-11 DIAGNOSIS — J45909 Unspecified asthma, uncomplicated: Secondary | ICD-10-CM | POA: Diagnosis present

## 2017-08-11 DIAGNOSIS — Z79899 Other long term (current) drug therapy: Secondary | ICD-10-CM

## 2017-08-11 DIAGNOSIS — Z90710 Acquired absence of both cervix and uterus: Secondary | ICD-10-CM | POA: Diagnosis not present

## 2017-08-11 DIAGNOSIS — K589 Irritable bowel syndrome without diarrhea: Secondary | ICD-10-CM | POA: Diagnosis present

## 2017-08-11 DIAGNOSIS — F329 Major depressive disorder, single episode, unspecified: Secondary | ICD-10-CM | POA: Diagnosis present

## 2017-08-11 DIAGNOSIS — Z833 Family history of diabetes mellitus: Secondary | ICD-10-CM

## 2017-08-11 DIAGNOSIS — Z96652 Presence of left artificial knee joint: Secondary | ICD-10-CM

## 2017-08-11 DIAGNOSIS — J209 Acute bronchitis, unspecified: Secondary | ICD-10-CM | POA: Diagnosis not present

## 2017-08-11 DIAGNOSIS — E669 Obesity, unspecified: Secondary | ICD-10-CM | POA: Diagnosis present

## 2017-08-11 DIAGNOSIS — E039 Hypothyroidism, unspecified: Secondary | ICD-10-CM | POA: Diagnosis present

## 2017-08-11 DIAGNOSIS — E119 Type 2 diabetes mellitus without complications: Secondary | ICD-10-CM | POA: Diagnosis present

## 2017-08-11 DIAGNOSIS — Z8249 Family history of ischemic heart disease and other diseases of the circulatory system: Secondary | ICD-10-CM | POA: Diagnosis not present

## 2017-08-11 DIAGNOSIS — E7800 Pure hypercholesterolemia, unspecified: Secondary | ICD-10-CM | POA: Diagnosis present

## 2017-08-11 DIAGNOSIS — Z7984 Long term (current) use of oral hypoglycemic drugs: Secondary | ICD-10-CM | POA: Diagnosis not present

## 2017-08-11 DIAGNOSIS — J189 Pneumonia, unspecified organism: Secondary | ICD-10-CM | POA: Diagnosis not present

## 2017-08-11 DIAGNOSIS — R7981 Abnormal blood-gas level: Secondary | ICD-10-CM

## 2017-08-11 DIAGNOSIS — Z79890 Hormone replacement therapy (postmenopausal): Secondary | ICD-10-CM | POA: Diagnosis not present

## 2017-08-11 DIAGNOSIS — E785 Hyperlipidemia, unspecified: Secondary | ICD-10-CM | POA: Diagnosis present

## 2017-08-11 HISTORY — PX: TOTAL KNEE ARTHROPLASTY: SHX125

## 2017-08-11 LAB — TYPE AND SCREEN
ABO/RH(D): A NEG
ANTIBODY SCREEN: NEGATIVE

## 2017-08-11 LAB — GLUCOSE, CAPILLARY
GLUCOSE-CAPILLARY: 135 mg/dL — AB (ref 65–99)
Glucose-Capillary: 105 mg/dL — ABNORMAL HIGH (ref 65–99)

## 2017-08-11 SURGERY — ARTHROPLASTY, KNEE, TOTAL
Anesthesia: Spinal | Laterality: Left

## 2017-08-11 MED ORDER — VITAMIN B-12 1000 MCG PO TABS
1000.0000 ug | ORAL_TABLET | Freq: Every day | ORAL | Status: DC
  Administered 2017-08-12 – 2017-08-15 (×4): 1000 ug via ORAL
  Filled 2017-08-11 (×4): qty 1

## 2017-08-11 MED ORDER — HYOSCYAMINE SULFATE 0.125 MG SL SUBL
0.1250 mg | SUBLINGUAL_TABLET | Freq: Four times a day (QID) | SUBLINGUAL | Status: DC | PRN
  Filled 2017-08-11: qty 1

## 2017-08-11 MED ORDER — CYCLOBENZAPRINE HCL 10 MG PO TABS: 10.0000 mg | ORAL_TABLET | Freq: Two times a day (BID) | ORAL | Status: DC | PRN

## 2017-08-11 MED ORDER — SODIUM CHLORIDE 0.9 % IV SOLN
INTRAVENOUS | Status: DC | PRN
  Administered 2017-08-11: 60 mL

## 2017-08-11 MED ORDER — ALBUTEROL SULFATE (2.5 MG/3ML) 0.083% IN NEBU
2.5000 mg | INHALATION_SOLUTION | RESPIRATORY_TRACT | Status: DC | PRN
  Administered 2017-08-13: 2.5 mg via RESPIRATORY_TRACT
  Filled 2017-08-11: qty 3

## 2017-08-11 MED ORDER — HYDROMORPHONE HCL 1 MG/ML IJ SOLN: 1.0000 mg | INTRAMUSCULAR | Status: DC | PRN

## 2017-08-11 MED ORDER — SODIUM CHLORIDE 0.9 % IJ SOLN
INTRAMUSCULAR | Status: DC | PRN
  Administered 2017-08-11: 40 mL via INTRAVENOUS

## 2017-08-11 MED ORDER — DIPHENHYDRAMINE HCL 12.5 MG/5ML PO ELIX: 12.5000 mg | ORAL_SOLUTION | ORAL | Status: DC | PRN

## 2017-08-11 MED ORDER — PROPOFOL 10 MG/ML IV BOLUS
INTRAVENOUS | Status: AC
  Filled 2017-08-11: qty 60

## 2017-08-11 MED ORDER — BUPIVACAINE-EPINEPHRINE (PF) 0.5% -1:200000 IJ SOLN
INTRAMUSCULAR | Status: AC
  Filled 2017-08-11: qty 30

## 2017-08-11 MED ORDER — KETOROLAC TROMETHAMINE 30 MG/ML IJ SOLN
INTRAMUSCULAR | Status: AC
  Administered 2017-08-11: 30 mg via INTRAVENOUS
  Filled 2017-08-11: qty 1

## 2017-08-11 MED ORDER — POTASSIUM CHLORIDE IN NACL 20-0.9 MEQ/L-% IV SOLN
INTRAVENOUS | Status: DC
  Administered 2017-08-11 – 2017-08-15 (×2): via INTRAVENOUS
  Filled 2017-08-11 (×9): qty 1000

## 2017-08-11 MED ORDER — NEOMYCIN-POLYMYXIN B GU 40-200000 IR SOLN
Status: DC | PRN
  Administered 2017-08-11: 14 mL

## 2017-08-11 MED ORDER — TRANEXAMIC ACID 1000 MG/10ML IV SOLN
INTRAVENOUS | Status: DC | PRN
  Administered 2017-08-11: 1000 mg via INTRAVENOUS

## 2017-08-11 MED ORDER — DOCUSATE SODIUM 100 MG PO CAPS
100.0000 mg | ORAL_CAPSULE | Freq: Two times a day (BID) | ORAL | Status: DC
  Administered 2017-08-11 – 2017-08-15 (×8): 100 mg via ORAL
  Filled 2017-08-11 (×8): qty 1

## 2017-08-11 MED ORDER — PROPOFOL 10 MG/ML IV BOLUS
INTRAVENOUS | Status: DC | PRN
  Administered 2017-08-11: 40 mg via INTRAVENOUS

## 2017-08-11 MED ORDER — BENZONATATE 100 MG PO CAPS: 200.0000 mg | ORAL_CAPSULE | Freq: Three times a day (TID) | ORAL | Status: DC | PRN

## 2017-08-11 MED ORDER — ONDANSETRON HCL 4 MG/2ML IJ SOLN
INTRAMUSCULAR | Status: DC | PRN
  Administered 2017-08-11: 4 mg via INTRAVENOUS

## 2017-08-11 MED ORDER — DULOXETINE HCL 60 MG PO CPEP
60.0000 mg | ORAL_CAPSULE | Freq: Every day | ORAL | Status: DC
  Administered 2017-08-12 – 2017-08-15 (×3): 60 mg via ORAL
  Filled 2017-08-11 (×4): qty 1

## 2017-08-11 MED ORDER — FENTANYL CITRATE (PF) 100 MCG/2ML IJ SOLN
INTRAMUSCULAR | Status: DC | PRN
  Administered 2017-08-11 (×4): 25 ug via INTRAVENOUS

## 2017-08-11 MED ORDER — ATORVASTATIN CALCIUM 20 MG PO TABS
40.0000 mg | ORAL_TABLET | Freq: Every day | ORAL | Status: DC
Start: 2017-08-11 — End: 2017-08-15
  Administered 2017-08-11 – 2017-08-14 (×4): 40 mg via ORAL
  Filled 2017-08-11 (×4): qty 2

## 2017-08-11 MED ORDER — METFORMIN HCL 500 MG PO TABS
500.0000 mg | ORAL_TABLET | Freq: Two times a day (BID) | ORAL | Status: DC
  Filled 2017-08-11 (×3): qty 1

## 2017-08-11 MED ORDER — ONDANSETRON HCL 4 MG/2ML IJ SOLN: 4.0000 mg | Freq: Four times a day (QID) | INTRAMUSCULAR | Status: DC | PRN

## 2017-08-11 MED ORDER — VANCOMYCIN HCL IN DEXTROSE 1-5 GM/200ML-% IV SOLN
1000.0000 mg | Freq: Two times a day (BID) | INTRAVENOUS | Status: AC
  Administered 2017-08-11: 1000 mg via INTRAVENOUS
  Filled 2017-08-11: qty 200

## 2017-08-11 MED ORDER — DEXAMETHASONE SODIUM PHOSPHATE 10 MG/ML IJ SOLN
INTRAMUSCULAR | Status: AC
  Filled 2017-08-11: qty 1

## 2017-08-11 MED ORDER — METOCLOPRAMIDE HCL 10 MG PO TABS: 5.0000 mg | ORAL_TABLET | Freq: Three times a day (TID) | ORAL | Status: DC | PRN

## 2017-08-11 MED ORDER — TRANEXAMIC ACID 1000 MG/10ML IV SOLN
INTRAVENOUS | Status: AC
  Filled 2017-08-11: qty 10

## 2017-08-11 MED ORDER — KETOROLAC TROMETHAMINE 30 MG/ML IJ SOLN
30.0000 mg | Freq: Once | INTRAMUSCULAR | Status: AC
  Administered 2017-08-11: 30 mg via INTRAVENOUS

## 2017-08-11 MED ORDER — ENOXAPARIN SODIUM 40 MG/0.4ML ~~LOC~~ SOLN
40.0000 mg | SUBCUTANEOUS | Status: DC
  Administered 2017-08-12 – 2017-08-15 (×4): 40 mg via SUBCUTANEOUS
  Filled 2017-08-11 (×4): qty 0.4

## 2017-08-11 MED ORDER — OMEGA-3-ACID ETHYL ESTERS 1 G PO CAPS
1.0000 | ORAL_CAPSULE | Freq: Two times a day (BID) | ORAL | Status: DC
  Administered 2017-08-11 – 2017-08-15 (×8): 1 g via ORAL
  Filled 2017-08-11 (×8): qty 1

## 2017-08-11 MED ORDER — MIDAZOLAM HCL 2 MG/2ML IJ SOLN
INTRAMUSCULAR | Status: AC
  Filled 2017-08-11: qty 2

## 2017-08-11 MED ORDER — PROPOFOL 500 MG/50ML IV EMUL
INTRAVENOUS | Status: DC | PRN
  Administered 2017-08-11: 55 ug/kg/min via INTRAVENOUS

## 2017-08-11 MED ORDER — ACETAMINOPHEN 650 MG RE SUPP: 650.0000 mg | Freq: Four times a day (QID) | RECTAL | Status: DC | PRN

## 2017-08-11 MED ORDER — NEOMYCIN-POLYMYXIN B GU 40-200000 IR SOLN
Status: AC
  Filled 2017-08-11: qty 20

## 2017-08-11 MED ORDER — CHLORHEXIDINE GLUCONATE CLOTH 2 % EX PADS
6.0000 | MEDICATED_PAD | Freq: Every day | CUTANEOUS | Status: DC
  Administered 2017-08-12: 6 via TOPICAL

## 2017-08-11 MED ORDER — BUPIVACAINE LIPOSOME 1.3 % IJ SUSP
INTRAMUSCULAR | Status: AC
  Filled 2017-08-11: qty 20

## 2017-08-11 MED ORDER — MOMETASONE FURO-FORMOTEROL FUM 200-5 MCG/ACT IN AERO
2.0000 | INHALATION_SPRAY | Freq: Two times a day (BID) | RESPIRATORY_TRACT | Status: DC
  Administered 2017-08-11 – 2017-08-15 (×7): 2 via RESPIRATORY_TRACT
  Filled 2017-08-11: qty 8.8

## 2017-08-11 MED ORDER — FLEET ENEMA 7-19 GM/118ML RE ENEM: 1.0000 | ENEMA | Freq: Once | RECTAL | Status: DC | PRN

## 2017-08-11 MED ORDER — LOSARTAN POTASSIUM 50 MG PO TABS
100.0000 mg | ORAL_TABLET | Freq: Every day | ORAL | Status: DC
  Administered 2017-08-12 – 2017-08-15 (×4): 100 mg via ORAL
  Filled 2017-08-11 (×4): qty 2

## 2017-08-11 MED ORDER — LIDOCAINE HCL (PF) 2 % IJ SOLN
INTRAMUSCULAR | Status: AC
  Filled 2017-08-11: qty 10

## 2017-08-11 MED ORDER — PANTOPRAZOLE SODIUM 40 MG PO TBEC
40.0000 mg | DELAYED_RELEASE_TABLET | Freq: Two times a day (BID) | ORAL | Status: DC
  Administered 2017-08-11 – 2017-08-15 (×8): 40 mg via ORAL
  Filled 2017-08-11 (×8): qty 1

## 2017-08-11 MED ORDER — ACETAMINOPHEN 10 MG/ML IV SOLN
INTRAVENOUS | Status: DC | PRN
  Administered 2017-08-11: 1000 mg via INTRAVENOUS

## 2017-08-11 MED ORDER — BUPIVACAINE-EPINEPHRINE (PF) 0.5% -1:200000 IJ SOLN
INTRAMUSCULAR | Status: DC | PRN
  Administered 2017-08-11: 30 mL

## 2017-08-11 MED ORDER — KETOROLAC TROMETHAMINE 15 MG/ML IJ SOLN
15.0000 mg | Freq: Four times a day (QID) | INTRAMUSCULAR | Status: AC
Start: 2017-08-11 — End: 2017-08-12
  Administered 2017-08-11 – 2017-08-12 (×4): 15 mg via INTRAVENOUS
  Filled 2017-08-11 (×4): qty 1

## 2017-08-11 MED ORDER — SODIUM CHLORIDE 0.9 % IV SOLN
INTRAVENOUS | Status: DC
  Administered 2017-08-11: 09:00:00 via INTRAVENOUS

## 2017-08-11 MED ORDER — MIDAZOLAM HCL 5 MG/5ML IJ SOLN
INTRAMUSCULAR | Status: DC | PRN
  Administered 2017-08-11 (×2): 1 mg via INTRAVENOUS

## 2017-08-11 MED ORDER — LIDOCAINE HCL (CARDIAC) 20 MG/ML IV SOLN
INTRAVENOUS | Status: DC | PRN
  Administered 2017-08-11: 100 mg via INTRAVENOUS

## 2017-08-11 MED ORDER — FENTANYL CITRATE (PF) 100 MCG/2ML IJ SOLN
INTRAMUSCULAR | Status: AC
  Filled 2017-08-11: qty 2

## 2017-08-11 MED ORDER — BUPIVACAINE HCL (PF) 0.5 % IJ SOLN
INTRAMUSCULAR | Status: AC
  Filled 2017-08-11: qty 10

## 2017-08-11 MED ORDER — MAGNESIUM HYDROXIDE 400 MG/5ML PO SUSP
30.0000 mL | Freq: Every day | ORAL | Status: DC | PRN
  Administered 2017-08-13: 30 mL via ORAL
  Filled 2017-08-11: qty 30

## 2017-08-11 MED ORDER — ONDANSETRON HCL 4 MG/2ML IJ SOLN
INTRAMUSCULAR | Status: AC
  Filled 2017-08-11: qty 2

## 2017-08-11 MED ORDER — COENZYME Q-10 100 MG PO CAPS: 1.0000 | ORAL_CAPSULE | Freq: Every day | ORAL | Status: DC

## 2017-08-11 MED ORDER — ALBUTEROL SULFATE HFA 108 (90 BASE) MCG/ACT IN AERS: 2.0000 | INHALATION_SPRAY | RESPIRATORY_TRACT | Status: DC | PRN

## 2017-08-11 MED ORDER — LEVOTHYROXINE SODIUM 25 MCG PO TABS
125.0000 ug | ORAL_TABLET | Freq: Every day | ORAL | Status: DC
  Administered 2017-08-12 – 2017-08-15 (×4): 125 ug via ORAL
  Filled 2017-08-11 (×4): qty 1

## 2017-08-11 MED ORDER — ACETAMINOPHEN 325 MG PO TABS
650.0000 mg | ORAL_TABLET | Freq: Four times a day (QID) | ORAL | Status: DC | PRN
  Administered 2017-08-12 – 2017-08-14 (×5): 650 mg via ORAL
  Filled 2017-08-11 (×5): qty 2

## 2017-08-11 MED ORDER — ACETAMINOPHEN 10 MG/ML IV SOLN
INTRAVENOUS | Status: AC
  Filled 2017-08-11: qty 100

## 2017-08-11 MED ORDER — BUPIVACAINE HCL (PF) 0.5 % IJ SOLN
INTRAMUSCULAR | Status: DC | PRN
  Administered 2017-08-11: 2.8 mL

## 2017-08-11 MED ORDER — ACETAMINOPHEN 500 MG PO TABS
1000.0000 mg | ORAL_TABLET | Freq: Four times a day (QID) | ORAL | Status: AC
  Administered 2017-08-11 – 2017-08-12 (×4): 1000 mg via ORAL
  Filled 2017-08-11 (×4): qty 2

## 2017-08-11 MED ORDER — OXYCODONE HCL 5 MG PO TABS
5.0000 mg | ORAL_TABLET | ORAL | Status: DC | PRN
  Administered 2017-08-11: 5 mg via ORAL
  Administered 2017-08-12 – 2017-08-13 (×9): 10 mg via ORAL
  Administered 2017-08-14 (×3): 5 mg via ORAL
  Administered 2017-08-15: 10 mg via ORAL
  Filled 2017-08-11 (×5): qty 2
  Filled 2017-08-11: qty 1
  Filled 2017-08-11: qty 2
  Filled 2017-08-11: qty 1
  Filled 2017-08-11 (×2): qty 2
  Filled 2017-08-11 (×2): qty 1
  Filled 2017-08-11 (×2): qty 2

## 2017-08-11 MED ORDER — ONDANSETRON HCL 4 MG PO TABS: 4.0000 mg | ORAL_TABLET | Freq: Four times a day (QID) | ORAL | Status: DC | PRN

## 2017-08-11 MED ORDER — MUPIROCIN 2 % EX OINT
1.0000 "application " | TOPICAL_OINTMENT | Freq: Two times a day (BID) | CUTANEOUS | Status: DC
  Administered 2017-08-11 – 2017-08-15 (×7): 1 via NASAL
  Filled 2017-08-11: qty 22

## 2017-08-11 MED ORDER — METOCLOPRAMIDE HCL 5 MG/ML IJ SOLN
5.0000 mg | Freq: Three times a day (TID) | INTRAMUSCULAR | Status: DC | PRN
  Administered 2017-08-12: 10 mg via INTRAVENOUS
  Filled 2017-08-11: qty 2

## 2017-08-11 MED ORDER — SODIUM CHLORIDE 0.9 % IJ SOLN
INTRAMUSCULAR | Status: AC
  Filled 2017-08-11: qty 50

## 2017-08-11 MED ORDER — BISACODYL 10 MG RE SUPP
10.0000 mg | Freq: Every day | RECTAL | Status: DC | PRN
  Administered 2017-08-14: 10 mg via RECTAL
  Filled 2017-08-11 (×2): qty 1

## 2017-08-11 SURGICAL SUPPLY — 57 items
BANDAGE ELASTIC 6 CLIP ST LF (GAUZE/BANDAGES/DRESSINGS) ×3 IMPLANT
BLADE SAW SAG 25X90X1.19 (BLADE) ×3 IMPLANT
BLADE SURG SZ20 CARB STEEL (BLADE) ×3 IMPLANT
CANISTER SUCT 1200ML W/VALVE (MISCELLANEOUS) ×3 IMPLANT
CANISTER SUCT 3000ML PPV (MISCELLANEOUS) ×3 IMPLANT
CAPT KNEE TOTAL 3 ×3 IMPLANT
CATH TRAY METER 16FR LF (MISCELLANEOUS) ×3 IMPLANT
CEMENT BONE R 1X40 (Cement) ×6 IMPLANT
CEMENT VACUUM MIXING SYSTEM (MISCELLANEOUS) ×3 IMPLANT
CHLORAPREP W/TINT 26ML (MISCELLANEOUS) ×3 IMPLANT
COOLER POLAR GLACIER W/PUMP (MISCELLANEOUS) ×3 IMPLANT
COVER MAYO STAND STRL (DRAPES) ×3 IMPLANT
CUFF TOURN 24 STER (MISCELLANEOUS) IMPLANT
CUFF TOURN 30 STER DUAL PORT (MISCELLANEOUS) ×3 IMPLANT
DRAPE IMP U-DRAPE 54X76 (DRAPES) ×3 IMPLANT
DRAPE INCISE IOBAN 66X45 STRL (DRAPES) ×3 IMPLANT
DRAPE SHEET LG 3/4 BI-LAMINATE (DRAPES) ×3 IMPLANT
DRSG OPSITE POSTOP 4X10 (GAUZE/BANDAGES/DRESSINGS) ×3 IMPLANT
DRSG OPSITE POSTOP 4X12 (GAUZE/BANDAGES/DRESSINGS) IMPLANT
DRSG OPSITE POSTOP 4X14 (GAUZE/BANDAGES/DRESSINGS) IMPLANT
ELECT CAUTERY BLADE 6.4 (BLADE) ×3 IMPLANT
ELECT REM PT RETURN 9FT ADLT (ELECTROSURGICAL) ×3
ELECTRODE REM PT RTRN 9FT ADLT (ELECTROSURGICAL) ×1 IMPLANT
GLOVE BIO SURGEON STRL SZ7.5 (GLOVE) ×12 IMPLANT
GLOVE BIO SURGEON STRL SZ8 (GLOVE) ×12 IMPLANT
GLOVE BIOGEL PI IND STRL 8 (GLOVE) ×1 IMPLANT
GLOVE BIOGEL PI INDICATOR 8 (GLOVE) ×2
GLOVE INDICATOR 8.0 STRL GRN (GLOVE) ×3 IMPLANT
GOWN STRL REUS W/ TWL LRG LVL3 (GOWN DISPOSABLE) ×1 IMPLANT
GOWN STRL REUS W/ TWL XL LVL3 (GOWN DISPOSABLE) ×1 IMPLANT
GOWN STRL REUS W/TWL LRG LVL3 (GOWN DISPOSABLE) ×2
GOWN STRL REUS W/TWL XL LVL3 (GOWN DISPOSABLE) ×2
HOLDER FOLEY CATH W/STRAP (MISCELLANEOUS) ×3 IMPLANT
HOOD PEEL AWAY FLYTE STAYCOOL (MISCELLANEOUS) ×9 IMPLANT
IMMBOLIZER KNEE 19 BLUE UNIV (SOFTGOODS) ×3 IMPLANT
KIT RM TURNOVER STRD PROC AR (KITS) ×3 IMPLANT
NDL SAFETY 18GX1.5 (NEEDLE) ×3 IMPLANT
NEEDLE 18GX1X1/2 (RX/OR ONLY) (NEEDLE) ×3 IMPLANT
NEEDLE SPNL 20GX3.5 QUINCKE YW (NEEDLE) ×3 IMPLANT
NS IRRIG 1000ML POUR BTL (IV SOLUTION) ×3 IMPLANT
PACK TOTAL KNEE (MISCELLANEOUS) ×3 IMPLANT
PAD WRAPON POLAR KNEE (MISCELLANEOUS) ×1 IMPLANT
PULSAVAC PLUS IRRIG FAN TIP (DISPOSABLE) ×3
SOL .9 NS 3000ML IRR  AL (IV SOLUTION) ×2
SOL .9 NS 3000ML IRR AL (IV SOLUTION) ×1
SOL .9 NS 3000ML IRR UROMATIC (IV SOLUTION) ×1 IMPLANT
STAPLER SKIN PROX 35W (STAPLE) ×3 IMPLANT
SUCTION FRAZIER HANDLE 10FR (MISCELLANEOUS) ×2
SUCTION TUBE FRAZIER 10FR DISP (MISCELLANEOUS) ×1 IMPLANT
SUT VIC AB 0 CT1 36 (SUTURE) ×9 IMPLANT
SUT VIC AB 2-0 CT1 27 (SUTURE) ×6
SUT VIC AB 2-0 CT1 TAPERPNT 27 (SUTURE) ×3 IMPLANT
SYR 20CC LL (SYRINGE) ×3 IMPLANT
SYR 30ML LL (SYRINGE) ×9 IMPLANT
SYRINGE 10CC LL (SYRINGE) ×3 IMPLANT
TIP FAN IRRIG PULSAVAC PLUS (DISPOSABLE) ×1 IMPLANT
WRAPON POLAR PAD KNEE (MISCELLANEOUS) ×3

## 2017-08-11 NOTE — Op Note (Signed)
08/11/2017  12:48 PM  Patient:   Valerie Solis  Pre-Op Diagnosis:   Degenerative joint disease, left knee.  Post-Op Diagnosis:   Same  Procedure:   left TKA using all-cemented [employer] Vanguard system with a 70 mm PCR femur, a 71 mm tibial tray with a 12 mm E-poly insert, and a 37 x 10 mm all-poly 3-pegged domed patella.  Surgeon:   Maryagnes Amos, MD  Assistant:   Horris Latino, PA-C   Anesthesia:   Spinal  Findings:   As above  Complications:   None  EBL:   10 cc  Fluids:   700 cc crystalloid  UOP:   350 cc  TT:   90 minutes at 300 mmHg  Drains:   None  Closure:   Staples  Implants:   As above  Brief Clinical Note:   The patient is a a 66 year old female with a long history of progressively worsening left knee pain. The patient's symptoms have progressed despite medications, activity modification, injections, etc. The patient's history and examination were consistent with advanced degenerative joint disease of the right knee confirmed by plain radiographs. The patient presents at this time for a left total knee arthroplasty.  Procedure:   The patient was brought into the operating room. After adequate spinal anesthesia was obtained, the patient was lain in the supine position. A Foley catheter was placed by the nurse before the right lower extremity was prepped with ChloraPrep solution and draped sterilely. Preoperative antibiotics were administered. After verifying the proper laterality with a surgical timeout, the limb was exsanguinated with an Esmarch and the tourniquet inflated to 300 mmHg. A standard anterior approach to the knee was made through an approximately 7 inch incision. The incision was carried down through the subcutaneous tissues to expose superficial retinaculum. This was split the length of the incision and the medial flap elevated sufficiently to expose the medial retinaculum. The medial retinaculum was incised, leaving a 3-4 mm cuff of tissue on the  patella. This was extended distally along the medial border of the patellar tendon and proximally through the medial third of the quadriceps tendon. A subtotal fat pad excision was performed before the soft tissues were elevated off the anteromedial and anterolateral aspects of the proximal tibia to the level of the collateral ligaments. The anterior portions of the medial and lateral menisci were removed, as was the anterior cruciate ligament. With the knee flexed to 90, the external tibial guide was positioned and the appropriate proximal tibial cut made. This piece was taken to the back table where it was measured and found to be optimally replicated by a 71 mm component.  Attention was directed to the distal femur. The intramedullary canal was accessed through a 3/8" drill hole. The intramedullary guide was inserted and position in order to obtain a neutral flexion gap. The intercondylar block was positioned with care taken to avoid notching the anterior cortex of the femur. The appropriate cut was made. Next, the distal cutting block was placed at 6 of valgus alignment. Using the 9 mm slot, the distal cut was made. The distal femur was measured and found to be optimally replicated by the 70 mm component. The 70 mm 4-in-1 cutting block was positioned and first the posterior, then the posterior chamfer, the anterior chamfer, femoral and intercondylar cuts were made. At this point, the posterior portions medial and lateral menisci were removed. A trial reduction was performed using the appropriate femoral and tibial components with first the  10 mm and then the 12 mm insert. The 12 mm insert demonstrated excellent stability to varus and valgus stressing both in flexion and extension while permitting full extension. Patella tracking was assessed and found to be excellent. Therefore, the tibial guide position was marked on the proximal tibia. The patella thickness was measured and found to be 23 mm. Therefore,  the appropriate cut was made. The patellar surface was measured and found to be optimally replicated by the 37 mm component. The three peg holes were drilled in place before the trial button was inserted. Patella tracking was assessed and found to be excellent, passing the "no thumb test". The lug holes were drilled into the distal femur before the trial component was removed, leaving only the tibial tray. The keel was then created using the appropriate tower, reamer, and punch.  The bony surfaces were prepared for cementing by irrigating thoroughly with bacitracin saline solution. A bone plug was fashioned from some of the bone that had been removed previously and used to plug the distal femoral canal. In addition, 20 cc of Exparel diluted out to 60 cc with normal saline and 30 cc of 0.5% Sensorcaine were injected into the postero-medial and postero-lateral aspects of the knee, the medial and lateral gutter regions, and the peri-incisional tissues to help with postoperative analgesia. Meanwhile, the cement was being mixed on the back table. When it was ready, the tibial tray was cemented in first. The excess cement was removed using Personal assistantreer elevators. Next, the femoral component was impacted into place. Again, the excess cement was removed using Personal assistantreer elevators. The 12 mm trial insert was positioned and the knee brought into extension while the cement hardened. Finally, the patella was cemented into place and secured using the patellar clamp. Again, the excess cement was removed using Personal assistantreer elevators. Once the cement had hardened, the knee was placed through a range of motion with the findings as described above. Therefore, the trial insert was removed and, after verifying that no cement had been retained posteriorly, the permanent insert was positioned and secured using the appropriate key locking mechanism. Again the knee was placed through a range of motion with the findings as described above.  The wound was  copiously irrigated with bacitracin saline solution using the jet lavage system before the quadriceps tendon and retinacular layer were reapproximated using #0 Vicryl interrupted sutures. The superficial retinacular layer also was closed using a running #0 Vicryl suture. A total of 10 cc of transexemic acid (TXA) was injected intra-articularly before the subcutaneous tissues were closed in several layers using 2-0 Vicryl interrupted sutures. The skin was closed using staples. A sterile honeycomb dressing was applied to the skin before the leg was wrapped with an Ace wrap to accommodate the polar pack. The patient was then awakened and returned to the recovery room in satisfactory condition after tolerating the procedure well.

## 2017-08-11 NOTE — Evaluation (Signed)
Physical Therapy Evaluation Patient Details Name: Valerie Solis MRN: [id number] DOB: 10/12/1951 Today's Date: 08/11/2017   History of Present Illness  Pt is a 66 y.o. 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  PMH includes htn, sleep apnea, anxiety, DM type 2, lumbar and cervical surgery, h/o IBS.  Clinical Impression  Prior to hospital admission, pt was independent and working full time.  Pt lives with her husband on main floor of home with 4 STE B railings.  Currently pt is SBA supine to sit and CGA with transfers and walking a few feet bed to recliner with RW.  Pt able to perform L LE SLR independently.  Pt would benefit from skilled PT to address noted impairments and functional limitations (see below for any additional details).  Upon hospital discharge, recommend pt discharge to home with HHPT.    Follow Up Recommendations Home health PT    Equipment Recommendations  Rolling walker with 5" wheels    Recommendations for Other Services       Precautions / Restrictions Precautions Precautions: Fall;Knee Precaution Booklet Issued: Yes (comment) Restrictions Weight Bearing Restrictions: Yes LLE Weight Bearing: Weight bearing as tolerated      Mobility  Bed Mobility Overal bed mobility: Needs Assistance Bed Mobility: Supine to Sit     Supine to sit: Supervision;HOB elevated     General bed mobility comments: assist for lines; SBA for safety  Transfers Overall transfer level: Needs assistance Equipment used: Rolling walker (2 wheeled) Transfers: Sit to/from Stand Sit to Stand: Min guard         General transfer comment: vc's for UE and LE placement; CGA for safety  Ambulation/Gait Ambulation/Gait assistance: Min guard Ambulation Distance (Feet): 3 Feet (bed to recliner) Assistive device: Rolling walker (2 wheeled)   Gait velocity: decreased   General Gait Details: vc's for technique and walker use; decreased stance time L LE; vc's to increase UE support  through [employer]  Stairs            Wheelchair Mobility    Modified Rankin (Stroke Patients Only)       Balance Overall balance assessment: Needs assistance Sitting-balance support: Bilateral upper extremity supported;Feet supported Sitting balance-Leahy Scale: Fair Sitting balance - Comments: static sitting   Standing balance support: Bilateral upper extremity supported (on RW) Standing balance-Leahy Scale: Fair Standing balance comment: static standing                             Pertinent Vitals/Pain Pain Assessment: 0-10 Pain Score: 5  Pain Location: L knee Pain Descriptors / Indicators: Sore Pain Intervention(s): Limited activity within patient's tolerance;Monitored during session;Repositioned;Patient requesting pain meds-RN notified  Vitals (HR and O2 on room air) stable and WFL throughout treatment session.    Home Living Family/patient expects to be discharged to:: Private residence Living Arrangements: Spouse/significant other Available Help at Discharge: Family Type of Home: House Home Access: Stairs to enter Entrance Stairs-Rails: Right;Left;Can reach both Entrance Stairs-Number of Steps: 4 Home Layout: Two level;Able to live on main level with bedroom/bathroom Home Equipment: Grab bars - tub/shower      Prior Function Level of Independence: Independent         Comments: Pt works full time (desk job but also requires walking).  Reports 1 fall in past 6 months.     Hand Dominance        Extremity/Trunk Assessment   Upper Extremity Assessment Upper Extremity Assessment: Overall [facility]FL  for tasks assessed    Lower Extremity Assessment Lower Extremity Assessment:  (able to perform L LE SLR on own with vc's for quad set; at least 3/5 DF/PF)    Cervical / Trunk Assessment Cervical / Trunk Assessment: Normal  Communication   Communication: No difficulties  Cognition Arousal/Alertness: Awake/alert Behavior During Therapy: WFL for tasks  assessed/performed Overall Cognitive Status: Within Functional Limits for tasks assessed                                        General Comments General comments (skin integrity, edema, etc.): Polar care and L knee dressings in place.  Nursing cleared pt for participation in physical therapy.  Pt agreeable to PT session.    Exercises Total Joint Exercises Ankle Circles/Pumps: AROM;Strengthening;Both;10 reps;Supine Quad Sets: AROM;Strengthening;Both;10 reps;Supine Short Arc Quad: AAROM;Strengthening;Left;10 reps;Supine Heel Slides: AAROM;Strengthening;Left;10 reps;Supine Hip ABduction/ADduction: AROM;Strengthening;Left;10 reps;Supine Straight Leg Raises: AROM;Strengthening;Left;10 reps;Supine Goniometric ROM: L knee extension 8 degrees short of neutral semi-supine in bed; L knee flexion 80 degrees AROM in sitting   Assessment/Plan    PT Assessment Patient needs continued PT services  PT Problem List Decreased strength;Decreased range of motion;Decreased activity tolerance;Decreased balance;Decreased mobility;Decreased knowledge of use of DME;Decreased knowledge of precautions;Pain       PT Treatment Interventions DME instruction;Gait training;Stair training;Functional mobility training;Therapeutic activities;Therapeutic exercise;Balance training;Patient/family education    PT Goals (Current goals can be found in the Care Plan section)  Acute Rehab PT Goals Patient Stated Goal: to go home PT Goal Formulation: With patient Time For Goal Achievement: 08/25/17 Potential to Achieve Goals: Good    Frequency BID   Barriers to discharge        Co-evaluation               AM-PAC PT "6 Clicks" Daily Activity  Outcome Measure Difficulty turning over in bed (including adjusting bedclothes, sheets and blankets)?: A Little Difficulty moving from lying on back to sitting on the side of the bed? : A Little Difficulty sitting down on and standing up from a chair with  arms (e.g., wheelchair, bedside commode, etc,.)?: Unable Help needed moving to and from a bed to chair (including a wheelchair)?: A Little Help needed walking in hospital room?: A Little Help needed climbing 3-5 steps with a railing? : A Lot 6 Click Score: 15    End of Session Equipment Utilized During Treatment: Gait belt Activity Tolerance: Patient tolerated treatment well Patient left: in chair;with chair alarm set;with nursing/sitter in room;with family/visitor present (end of session nursing present (performing skin check) and nursing reported they would set pt up once they were finished) Nurse Communication: Mobility status;Precautions;Weight bearing status PT Visit Diagnosis: Other abnormalities of gait and mobility (R26.89);Muscle weakness (generalized) (M62.81);Pain Pain - Right/Left: Left Pain - part of body: Knee    Time: 1610-9604 PT Time Calculation (min) (ACUTE ONLY): 26 min   Charges:   PT Evaluation $PT Eval Low Complexity: 1 Low PT Treatments $Therapeutic Exercise: 8-22 mins   PT G Codes:   PT G-Codes **NOT FOR INPATIENT CLASS** Functional Assessment Tool Used: AM-PAC 6 Clicks Basic Mobility Functional Limitation: Mobility: Walking and moving around Mobility: Walking and Moving Around Current Status (V4098): At least 40 percent but less than 60 percent impaired, limited or restricted Mobility: Walking and Moving Around Goal Status [phone]): 0 percent impaired, limited or restricted    Hendricks Limes, PT 08/11/17, 5:19 PM  [phone]   

## 2017-08-11 NOTE — Anesthesia Post-op Follow-up Note (Signed)
Anesthesia QCDR form completed.        

## 2017-08-11 NOTE — Transfer of Care (Signed)
Immediate Anesthesia Transfer of Care Note  Patient: Valerie Solis  Procedure(s) Performed: TOTAL KNEE ARTHROPLASTY (Left )  Patient Location: PACU  Anesthesia Type:Spinal  Level of Consciousness: awake, alert  and oriented  Airway & Oxygen Therapy: Patient Spontanous Breathing and Patient connected to face mask oxygen  Post-op Assessment: Report given to RN and Post -op Vital signs reviewed and stable  Post vital signs: Reviewed and stable  Last Vitals:  Vitals:   08/11/17 0853  BP: (!) 154/91  Pulse: 75  Resp: 16  Temp: 36.5 C  SpO2: 96%    Last Pain:  Vitals:   08/11/17 0853  TempSrc: Tympanic  PainSc: 6          Complications: No apparent anesthesia complications

## 2017-08-11 NOTE — NC FL2 (Signed)
[place] MEDICAID FL2 LEVEL OF CARE SCREENING TOOL     IDENTIFICATION  Patient Name: Valerie Solis Birthdate: 1951-05-13 Sex: female Admission Date (Current Location): 08/11/2017  [place] and [place] Number:  Chiropodist and Address:  [facility], [address] [place], [place] [address]      Provider Number: [id number]  Attending Physician Name and Address:  Christena Flake, MD  Relative Name and Phone Number:       Current Level of Care: Hospital Recommended Level of Care: Skilled Nursing Facility Prior Approval Number:    Date Approved/Denied:   PASRR Number:  [id number])  Discharge Plan: SNF    Current Diagnoses: Patient Active Problem List   Diagnosis Date Noted  . Status post total knee replacement using cement, left 08/11/2017    Orientation RESPIRATION BLADDER Height & Weight     Self, Time, Situation, Place  Normal Continent Weight: 219 lb (99.3 kg) Height:  5\' 6"  (167.6 cm)  BEHAVIORAL SYMPTOMS/MOOD NEUROLOGICAL BOWEL NUTRITION STATUS      Continent Diet (Clear Liquid to be advanced)  AMBULATORY STATUS COMMUNICATION OF NEEDS Skin   Extensive Assist Verbally Surgical wounds (Incision Left Knee)                       Personal Care Assistance Level of Assistance  Bathing, Feeding, Dressing Bathing Assistance: Limited assistance Feeding assistance: Independent Dressing Assistance: Limited assistance     Functional Limitations Info  Sight, Hearing, Speech Sight Info: Adequate Hearing Info: Adequate Speech Info: Adequate    SPECIAL CARE FACTORS FREQUENCY  PT (By licensed PT), OT (By licensed OT)     PT Frequency:  (5) OT Frequency:  (5)            Contractures      Additional Factors Info  Code Status, Allergies, Isolation Precautions Code Status Info:  (Full Code) Allergies Info:  (ACE INHIBITORS, SULFA ANTIBIOTICS )     Isolation Precautions Info:  (Nasal Swab)     Current  Medications (08/11/2017):  This is the current hospital active medication list Current Facility-Administered Medications  Medication Dose Route Frequency Provider Last Rate Last Dose  . 0.9 % NaCl with KCl 20 mEq/ L  infusion   Intravenous Continuous Poggi, Excell Seltzer, MD      . acetaminophen (TYLENOL) tablet 650 mg  650 mg Oral Q6H PRN Poggi, Excell Seltzer, MD       Or  . acetaminophen (TYLENOL) suppository 650 mg  650 mg Rectal Q6H PRN Poggi, Excell Seltzer, MD      . acetaminophen (TYLENOL) tablet 1,000 mg  1,000 mg Oral Q6H Poggi, Excell Seltzer, MD      . albuterol (PROVENTIL) (2.5 MG/3ML) 0.083% nebulizer solution 2.5 mg  2.5 mg Nebulization Q4H PRN Poggi, Excell Seltzer, MD      . atorvastatin (LIPITOR) tablet 40 mg  40 mg Oral q1800 Poggi, Excell Seltzer, MD      . benzonatate (TESSALON) capsule 200 mg  200 mg Oral TID PRN Poggi, Excell Seltzer, MD      . bisacodyl (DULCOLAX) suppository 10 mg  10 mg Rectal Daily PRN Poggi, Excell Seltzer, MD      . Melene Muller ON 08/12/2017] Chlorhexidine Gluconate Cloth 2 % PADS 6 each  6 each Topical Q0600 Poggi, Excell Seltzer, MD      . cyclobenzaprine (FLEXERIL) tablet 10 mg  10 mg Oral BID PRN Poggi, Excell Seltzer, MD      .  diphenhydrAMINE (BENADRYL) 12.5 MG/5ML elixir 12.5-25 mg  12.5-25 mg Oral Q4H PRN Poggi, Excell SeltzerJohn J, MD      . docusate sodium (COLACE) capsule 100 mg  100 mg Oral BID Poggi, Excell SeltzerJohn J, MD      . Melene Muller[START ON 08/12/2017] DULoxetine (CYMBALTA) DR capsule 60 mg  60 mg Oral Daily Poggi, Excell SeltzerJohn J, MD      . Melene Muller[START ON 08/12/2017] enoxaparin (LOVENOX) injection 40 mg  40 mg Subcutaneous Q24H Poggi, Excell SeltzerJohn J, MD      . HYDROmorphone (DILAUDID) injection 1-2 mg  1-2 mg Intravenous Q2H PRN Poggi, Excell SeltzerJohn J, MD      . hyoscyamine (LEVSIN SL) SL tablet 0.125 mg  0.125 mg Oral Q6H PRN Poggi, Excell SeltzerJohn J, MD      . ketorolac (TORADOL) 15 MG/ML injection 15 mg  15 mg Intravenous Q6H Poggi, Excell SeltzerJohn J, MD      . Melene Muller[START ON 08/12/2017] levothyroxine (SYNTHROID, LEVOTHROID) tablet 125 mcg  125 mcg Oral QAC breakfast Poggi, Excell SeltzerJohn J, MD      .  losartan (COZAAR) tablet 100 mg  100 mg Oral Daily Poggi, Excell SeltzerJohn J, MD      . magnesium hydroxide (MILK OF MAGNESIA) suspension 30 mL  30 mL Oral Daily PRN Poggi, Excell SeltzerJohn J, MD      . metFORMIN (GLUCOPHAGE) tablet 500 mg  500 mg Oral BID WC Poggi, Excell SeltzerJohn J, MD      . metoCLOPramide (REGLAN) tablet 5-10 mg  5-10 mg Oral Q8H PRN Poggi, Excell SeltzerJohn J, MD       Or  . metoCLOPramide (REGLAN) injection 5-10 mg  5-10 mg Intravenous Q8H PRN Poggi, Excell SeltzerJohn J, MD      . mometasone-formoterol (DULERA) 200-5 MCG/ACT inhaler 2 puff  2 puff Inhalation BID Poggi, Excell SeltzerJohn J, MD      . mupirocin ointment (BACTROBAN) 2 % 1 application  1 application Nasal BID Poggi, Excell SeltzerJohn J, MD      . omega-3 acid ethyl esters (LOVAZA) capsule 1 g  1 capsule Oral BID Poggi, Excell SeltzerJohn J, MD      . ondansetron (ZOFRAN) tablet 4 mg  4 mg Oral Q6H PRN Poggi, Excell SeltzerJohn J, MD       Or  . ondansetron (ZOFRAN) injection 4 mg  4 mg Intravenous Q6H PRN Poggi, Excell SeltzerJohn J, MD      . oxyCODONE (Oxy IR/ROXICODONE) immediate release tablet 5-10 mg  5-10 mg Oral Q3H PRN Poggi, Excell SeltzerJohn J, MD      . pantoprazole (PROTONIX) EC tablet 40 mg  40 mg Oral BID Poggi, Excell SeltzerJohn J, MD      . sodium phosphate (FLEET) 7-19 GM/118ML enema 1 enema  1 enema Rectal Once PRN Poggi, Excell SeltzerJohn J, MD      . vancomycin (VANCOCIN) IVPB 1000 mg/200 mL premix  1,000 mg Intravenous Q12H Poggi, Excell SeltzerJohn J, MD      . vitamin B-12 (CYANOCOBALAMIN) tablet 1,000 mcg  1,000 mcg Oral Daily Poggi, Excell SeltzerJohn J, MD         Discharge Medications: Please see discharge summary for a list of discharge medications.  Relevant Imaging Results:  Relevant Lab Results:   Additional Information  (SSN: [id number])  Payton SparkAnanda A Hebe Merriwether, Student-Social Work

## 2017-08-11 NOTE — Anesthesia Procedure Notes (Signed)
Spinal  Patient location during procedure: OR Staffing Anesthesiologist: Luevenia Mcavoy Performed: anesthesiologist  Preanesthetic Checklist Completed: patient identified, site marked, surgical consent, pre-op evaluation, timeout performed, IV checked and risks and benefits discussed Spinal Block Patient position: sitting Prep: Betadine Patient monitoring: heart rate, cardiac monitor, continuous pulse ox and blood pressure Approach: midline Location: L3-4 Injection technique: single-shot Needle Needle type: Pencil-Tip  Needle gauge: 25 G Needle length: 9 cm Assessment Sensory level: T10     

## 2017-08-11 NOTE — Anesthesia Preprocedure Evaluation (Addendum)
Anesthesia Evaluation  Patient identified by MRN, date of birth, ID band Patient awake    Reviewed: Allergy & Precautions, NPO status , Patient's Chart, lab work & pertinent test results, reviewed documented beta blocker date and time   Airway Mallampati: III  TM Distance: >3 FB     Dental  (+) Chipped, Partial Lower, Missing   Pulmonary sleep apnea ,           Cardiovascular hypertension, Pt. on medications      Neuro/Psych Anxiety    GI/Hepatic   Endo/Other  diabetes, Type 2  Renal/GU      Musculoskeletal  (+) Arthritis ,   Abdominal   Peds  Hematology   Anesthesia Other Findings Lumbar and cervical surgery. Neck movement ok. Hx of IBS. Obese. CXR and EKG ok.  Reproductive/Obstetrics                            Anesthesia Physical Anesthesia Plan  ASA: III  Anesthesia Plan: Spinal   Post-op Pain Management:    Induction:   PONV Risk Score and Plan:   Airway Management Planned:   Additional Equipment:   Intra-op Plan:   Post-operative Plan:   Informed Consent: I have reviewed the patients History and Physical, chart, labs and discussed the procedure including the risks, benefits and alternatives for the proposed anesthesia with the patient or authorized representative who has indicated his/her understanding and acceptance.     Plan Discussed with: CRNA  Anesthesia Plan Comments:         Anesthesia Quick Evaluation

## 2017-08-11 NOTE — H&P (Signed)
Paper H&P to be scanned into permanent record. H&P reviewed and patient re-examined. No changes. 

## 2017-08-11 NOTE — Anesthesia Procedure Notes (Signed)
Date/Time: 08/11/2017 10:30 AM Performed by: Ginger CarneMICHELET, Tatym Schermer Pre-anesthesia Checklist: Patient identified, Emergency Drugs available, Suction available, Patient being monitored and Timeout performed Patient Re-evaluated:Patient Re-evaluated prior to induction Oxygen Delivery Method: Simple face mask Preoxygenation: Pre-oxygenation with 100% oxygen

## 2017-08-12 ENCOUNTER — Encounter: Payer: Self-pay | Admitting: Surgery

## 2017-08-12 LAB — BASIC METABOLIC PANEL
ANION GAP: 6 (ref 5–15)
BUN: 13 mg/dL (ref 6–20)
CALCIUM: 8.7 mg/dL — AB (ref 8.9–10.3)
CO2: 28 mmol/L (ref 22–32)
CREATININE: 0.7 mg/dL (ref 0.44–1.00)
Chloride: 103 mmol/L (ref 101–111)
Glucose, Bld: 145 mg/dL — ABNORMAL HIGH (ref 65–99)
Potassium: 4 mmol/L (ref 3.5–5.1)
SODIUM: 137 mmol/L (ref 135–145)

## 2017-08-12 LAB — CBC WITH DIFFERENTIAL/PLATELET
BASOS ABS: 0.1 10*3/uL (ref 0–0.1)
BASOS PCT: 1 %
EOS ABS: 0.2 10*3/uL (ref 0–0.7)
EOS PCT: 3 %
HCT: 35.2 % (ref 35.0–47.0)
Hemoglobin: 12.3 g/dL (ref 12.0–16.0)
Lymphocytes Relative: 19 %
Lymphs Abs: 1.1 10*3/uL (ref 1.0–3.6)
MCH: 32.3 pg (ref 26.0–34.0)
MCHC: 35 g/dL (ref 32.0–36.0)
MCV: 92.3 fL (ref 80.0–100.0)
MONO ABS: 0.6 10*3/uL (ref 0.2–0.9)
MONOS PCT: 10 %
NEUTROS ABS: 4 10*3/uL (ref 1.4–6.5)
Neutrophils Relative %: 67 %
PLATELETS: 145 10*3/uL — AB (ref 150–440)
RBC: 3.81 MIL/uL (ref 3.80–5.20)
RDW: 12.5 % (ref 11.5–14.5)
WBC: 5.9 10*3/uL (ref 3.6–11.0)

## 2017-08-12 LAB — GLUCOSE, CAPILLARY: Glucose-Capillary: 143 mg/dL — ABNORMAL HIGH (ref 65–99)

## 2017-08-12 MED ORDER — OXYCODONE HCL 5 MG PO TABS: 5.0000 mg | ORAL_TABLET | ORAL | 0 refills | Status: AC | PRN

## 2017-08-12 MED ORDER — ENOXAPARIN SODIUM 40 MG/0.4ML ~~LOC~~ SOLN: 40.0000 mg | SUBCUTANEOUS | 0 refills | Status: AC

## 2017-08-12 NOTE — Progress Notes (Signed)
Took over care of patient at 1500. A&O x 4. On isolation.

## 2017-08-12 NOTE — Progress Notes (Signed)
Subjective: 1 Day Post-Op Procedure(s) (LRB): TOTAL KNEE ARTHROPLASTY (Left) Patient reports pain as moderate.   Patient is well, and has had no acute complaints or problems PT and care management to assist with discharge planning. Negative for chest pain and shortness of breath Fever: no Gastrointestinal:Negative for nausea and vomiting  Objective: Vital signs in last 24 hours: Temp:  [97.7 F (36.5 C)-98 F (36.7 C)] 97.7 F (36.5 C) (10/19 0745) Pulse Rate:  [61-85] 65 (10/19 0745) Resp:  [9-19] 18 (10/19 0745) BP: (121-154)/(67-91) 121/67 (10/19 0745) SpO2:  [92 %-100 %] 92 % (10/19 0745) Weight:  [99.3 kg (219 lb)] 99.3 kg (219 lb) (10/18 0929)  Intake/Output from previous day:  Intake/Output Summary (Last 24 hours) at 08/12/17 0811 Last data filed at 08/12/17 0625  Gross per 24 hour  Intake             2175 ml  Output             1560 ml  Net              615 ml    Intake/Output this shift: No intake/output data recorded.  Labs:  Recent Labs  08/12/17 0435  HGB 12.3    Recent Labs  08/12/17 0435  WBC 5.9  RBC 3.81  HCT 35.2  PLT 145*    Recent Labs  08/12/17 0435  NA 137  K 4.0  CL 103  CO2 28  BUN 13  CREATININE 0.70  GLUCOSE 145*  CALCIUM 8.7*   No results for input(s): LABPT, INR in the last 72 hours.   EXAM General - Patient is Alert, Appropriate and Oriented Extremity - ABD soft Sensation intact distally Intact pulses distally Dorsiflexion/Plantar flexion intact Incision: dressing C/D/I No cellulitis present Dressing/Incision - clean, dry, no drainage Motor Function - intact, moving foot and toes well on exam.   Abdomen soft on exam, normal BS without tympany.  Past Medical History:  Diagnosis Date  . Anxiety   . Arthritis   . Diabetes mellitus without complication (HCC)   . GERD (gastroesophageal reflux disease)   . Hypercholesterolemia   . Hypertension   . IBS (irritable bowel syndrome)   . Sleep apnea    OSA--USE  C-PAP  . Thyroid disease     Assessment/Plan: 1 Day Post-Op Procedure(s) (LRB): TOTAL KNEE ARTHROPLASTY (Left) Active Problems:   Status post total knee replacement using cement, left  Estimated body mass index is 35.35 kg/m as calculated from the following:   Height as of this encounter: 5\' 6"  (1.676 m).   Weight as of this encounter: 99.3 kg (219 lb). Advance diet Up with therapy D/C IV fluids when tolerating po intake.  Labs reviewed. Up with PT today, currently recommending HHPT. CBC and BMP ordered for tomorrow morning. Begin working on having a BM. Plan will be for discharge home tomorrow with HHPT.  DVT Prophylaxis - Lovenox, Foot Pumps and TED hose Weight-Bearing as tolerated to left leg  J. Horris LatinoLance Maeli Spacek, PA-C [facility] 08/12/2017, 8:11 AM

## 2017-08-12 NOTE — Progress Notes (Signed)
Physical Therapy Treatment Patient Details Name: Valerie Solis MRN: [id number] DOB: 17-Nov-1950 Today's Date: 08/12/2017    History of Present Illness Pt is a 66 y.o. 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  PMH includes htn, sleep apnea, anxiety, DM type 2, lumbar and cervical surgery, h/o IBS.    PT Comments    Pt able to progress ambulation distance to 60 feet with RW CGA with vc's for gait technique.  Pt's L knee pain 4-5/10 during session (pt received pain meds prior to PT session).  Overall tolerated session well.  Will focus on increasing distance with ambulation and also L LE strengthening and knee ROM ex's next session.    Follow Up Recommendations  Home health PT     Equipment Recommendations  Rolling walker with 5" wheels    Recommendations for Other Services       Precautions / Restrictions Precautions Precautions: Fall;Knee Precaution Booklet Issued: Yes (comment) Restrictions Weight Bearing Restrictions: Yes LLE Weight Bearing: Weight bearing as tolerated    Mobility  Bed Mobility Overal bed mobility: Needs Assistance Bed Mobility: Supine to Sit     Supine to sit: Supervision;HOB elevated     General bed mobility comments: mild increased effort to bring L LE out of bed on own; SBA for safety  Transfers Overall transfer level: Needs assistance Equipment used: Rolling walker (2 wheeled) Transfers: Sit to/from Stand Sit to Stand: Min guard         General transfer comment: intermittent vc's for UE and LE placement; CGA for safety  Ambulation/Gait Ambulation/Gait assistance: Min guard Ambulation Distance (Feet): 60 Feet Assistive device: Rolling walker (2 wheeled) Gait Pattern/deviations: Step-to pattern Gait velocity: decreased   General Gait Details: vc's for technique and walker use; decreased stance time L LE; vc's to increase UE support through [employer]    Modified Rankin (Stroke Patients  Only)       Balance Overall balance assessment: Needs assistance Sitting-balance support: Bilateral upper extremity supported;Feet supported Sitting balance-Leahy Scale: Good Sitting balance - Comments: sitting reaching within BOS     Standing balance-Leahy Scale: Fair Standing balance comment: able to take hands off RW to perform stand to sit without loss of balance (reached back for armrests of recliner)                            Cognition Arousal/Alertness: Awake/alert Behavior During Therapy: WFL for tasks assessed/performed Overall Cognitive Status: Within Functional Limits for tasks assessed                                        Exercises Total Joint Exercises Ankle Circles/Pumps: AROM;Strengthening;Both;10 reps;Supine Quad Sets: AROM;Strengthening;Both;10 reps;Supine Short Arc Quad: AROM;Strengthening;Left;10 reps;Supine Heel Slides: AAROM;Strengthening;Left;10 reps;Supine Hip ABduction/ADduction: AAROM;Strengthening;Left;10 reps;Supine Straight Leg Raises: AROM;Strengthening;Left;10 reps;Supine Goniometric ROM: L knee extension 4 degrees short of neutral semi-supine in bed; L knee flexion 80 degrees AAROM in sitting    General Comments General comments (skin integrity, edema, etc.): Polar care and L knee dressings in place.  Nursing cleared pt for participation in physical therapy.  Pt agreeable to PT session.      Pertinent Vitals/Pain Pain Assessment: 0-10 Pain Score: 5  Pain Descriptors / Indicators: Sore;Tender;Operative site guarding Pain Intervention(s): Limited activity within patient's tolerance;Monitored  during session;Premedicated before session;Repositioned;Ice applied  Vitals (HR and O2 on room air) stable and WFL throughout treatment session.    Home Living                      Prior Function            PT Goals (current goals can now be found in the care plan section) Acute Rehab PT Goals Patient Stated  Goal: to go home PT Goal Formulation: With patient Time For Goal Achievement: 08/25/17 Potential to Achieve Goals: Good Additional Goals Additional Goal #1: Pt's L knee ROM 0-90 degrees. Progress towards PT goals: Progressing toward goals    Frequency    BID      PT Plan Current plan remains appropriate    Co-evaluation              AM-PAC PT "6 Clicks" Daily Activity  Outcome Measure  Difficulty turning over in bed (including adjusting bedclothes, sheets and blankets)?: A Little Difficulty moving from lying on back to sitting on the side of the bed? : A Little Difficulty sitting down on and standing up from a chair with arms (e.g., wheelchair, bedside commode, etc,.)?: Unable Help needed moving to and from a bed to chair (including a wheelchair)?: A Little Help needed walking in hospital room?: A Little Help needed climbing 3-5 steps with a railing? : A Lot 6 Click Score: 15    End of Session Equipment Utilized During Treatment: Gait belt Activity Tolerance: Patient tolerated treatment well Patient left: in chair;with call bell/phone within reach;with chair alarm set;with SCD's reapplied (B heels elevated via towel rolls; polar care in place and activated) Nurse Communication: Mobility status;Precautions;Weight bearing status PT Visit Diagnosis: Other abnormalities of gait and mobility (R26.89);Muscle weakness (generalized) (M62.81);Pain Pain - Right/Left: Left Pain - part of body: Knee     Time: [id number] PT Time Calculation (min) (ACUTE ONLY): 25 min  Charges:  $Gait Training: 8-22 mins $Therapeutic Exercise: 8-22 mins                    G CodesHendricks Limes:       Jrue Yambao, PT 08/12/17, 9:09 AM [phone]

## 2017-08-12 NOTE — Discharge Instructions (Signed)

## 2017-08-12 NOTE — Care Management Note (Signed)
Case Management Note  Patient Details  Name: Valerie Solis MRN: [id number] Date of Birth: 08-21-1951  Subjective/Objective:  Met with patient and her spouse at bedside to discuss discharge planning. Offered choice of home health. [facility]. [facility]erral to [facility] with [facility]  She has a walker. Pharmacy: [place] [phone].Marland Kitchen Called Lovenox 40 mg # 14 no refills per Hinda Lenis, PA.                  Action/Plan: [facility] for PT, Lovenox called in, No DME  Expected Discharge Date:                  Expected Discharge Plan:  [place]  In-House Referral:     Discharge planning Services  CM Consult  Post Acute Care Choice:  Home Health Choice offered to:  Patient  DME Arranged:    DME Agency:     HH Arranged:  PT [place]:  [place]  Status of Service:  In process, will continue to follow  If discussed at Long Length of Stay Meetings, dates discussed:    Additional Comments:  Jolly Mango, RN 08/12/2017, 10:44 AM

## 2017-08-12 NOTE — Progress Notes (Signed)
Clinical Social Worker (CSW) received SNF consult. PT is recommending home health. RN case manager aware of above. Please reconsult if future social work needs arise. CSW signing off.   Jerae Izard, LCSW [phone] 

## 2017-08-12 NOTE — Discharge Summary (Signed)
Physician Discharge Summary  Patient ID: Valerie Solis MRN: [id number] DOB/AGE: 1951-04-05 66 y.o.  Admit date: 08/11/2017 Discharge date: 08/15/2017  Admission Diagnoses:  PRIMARY OSTEOARTHRITIS OF LEFT KNEE  Discharge Diagnoses: Patient Active Problem List   Diagnosis Date Noted  . Status post total knee replacement using cement, left 08/11/2017  Primary osteoarthritis of left knee  Past Medical History:  Diagnosis Date  . Anxiety   . Arthritis   . GERD (gastroesophageal reflux disease)   . Hypercholesterolemia   . Hypertension   . IBS (irritable bowel syndrome)   . Sleep apnea    OSA--USE C-PAP  . Thyroid disease      Transfusion: None.   Consultants (if any): Treatment Team:  Katha Hamming, MD  Discharged Condition: Improved  Hospital Course: Valerie Solis is an 66 y.o. female who was admitted 08/11/2017 with a diagnosis of primary osteoarthritis of the left knee and went to the operating room on 08/11/2017 and underwent the above named procedures.    Surgeries: Procedure(s): TOTAL KNEE ARTHROPLASTY on 08/11/2017 Patient tolerated the surgery well. Taken to PACU where she was stabilized and then transferred to the orthopedic floor.  Started on Lovenox 40mg  q 24 hrs. Foot pumps applied bilaterally at 80 mm. Heels elevated on bed with rolled towels. No evidence of DVT. Negative [place]. Physical therapy started on day #1 for gait training and transfer. OT started day #1 for ADL and assisted devices.  Patient's IV and foley were d/c on POD1  Over the weekend, the patient developed increased cough and congestion, CTA was negative for PE and did demonstrate some mild signs of pneumonia, started on IV Levaquin.  Implants: left TKA using all-cemented [employer] Vanguard system with a 70 mm PCR femur, a 71 mm tibial tray with a 12 mm E-poly insert, and a 37 x 10 mm all-poly 3-pegged domed patella.  She was given perioperative antibiotics:  Anti-infectives     Start     Dose/Rate Route Frequency Ordered Stop   08/15/17 0000  levofloxacin (LEVAQUIN) 750 MG tablet     750 mg Oral Daily 08/15/17 1207     08/14/17 0800  levofloxacin (LEVAQUIN) IVPB 750 mg     750 mg 100 mL/hr over 90 Minutes Intravenous Daily 08/14/17 0723     08/11/17 2200  vancomycin (VANCOCIN) IVPB 1000 mg/200 mL premix     1,000 mg 200 mL/hr over 60 Minutes Intravenous Every 12 hours 08/11/17 1406 08/12/17 0100   08/10/17 2245  vancomycin (VANCOCIN) IVPB 1000 mg/200 mL premix     1,000 mg 200 mL/hr over 60 Minutes Intravenous  Once 08/10/17 2241 08/11/17 1041    .  She was given sequential compression devices, early ambulation, and Lovenox for DVT prophylaxis.  She benefited maximally from the hospital stay and there were no complications.    Recent vital signs:  Vitals:   08/14/17 1521 08/14/17 2111  BP: 137/60 130/73  Pulse: 89 84  Resp: 18 18  Temp: 98.1 F (36.7 C)   SpO2: 94% 93%    Recent laboratory studies:  Lab Results  Component Value Date   HGB 12.1 08/13/2017   HGB 12.3 08/12/2017   HGB 13.9 07/27/2017   Lab Results  Component Value Date   WBC 10.6 08/13/2017   PLT 162 08/13/2017   Lab Results  Component Value Date   INR 0.99 07/27/2017   Lab Results  Component Value Date   NA 132 (L) 08/14/2017   K 4.8 08/14/2017  CL 97 (L) 08/14/2017   CO2 27 08/14/2017   BUN 16 08/14/2017   CREATININE 0.75 08/14/2017   GLUCOSE 231 (H) 08/14/2017    Discharge Medications:   Allergies as of 08/15/2017      Reactions   Ace Inhibitors Cough   Sulfa Antibiotics Rash      Medication List    TAKE these medications   albuterol 108 (90 Base) MCG/ACT inhaler Commonly known as:  PROVENTIL HFA;VENTOLIN HFA Inhale 2 puffs into the lungs every 4 (four) hours as needed.   azithromycin 250 MG tablet Commonly known as:  ZITHROMAX Take by mouth daily.   benzonatate 200 MG capsule Commonly known as:  TESSALON Take 200 mg by mouth 3 (three) times  daily as needed for cough.   Coenzyme Q-10 100 MG capsule Take 1 capsule by mouth at bedtime.   cyclobenzaprine 10 MG tablet Commonly known as:  FLEXERIL Take 10 mg by mouth 2 (two) times daily as needed for muscle spasms.   DULoxetine 60 MG capsule Commonly known as:  CYMBALTA Take 60 mg by mouth daily.   enoxaparin 40 MG/0.4ML injection Commonly known as:  LOVENOX Inject 0.4 mLs (40 mg total) into the skin daily.   famotidine 20 MG tablet Commonly known as:  PEPCID Take 40 mg by mouth 2 (two) times daily.   FISH OIL OMEGA-3 PO Take 2 capsules by mouth 2 (two) times daily.   hyoscyamine 0.125 MG SL tablet Commonly known as:  LEVSIN SL Take 0.125 mg by mouth every 6 (six) hours as needed.   levofloxacin 750 MG tablet Commonly known as:  LEVAQUIN Take 1 tablet (750 mg total) by mouth daily.   levothyroxine 125 MCG tablet Commonly known as:  SYNTHROID, LEVOTHROID Take 125 mcg by mouth daily before breakfast.   losartan 100 MG tablet Commonly known as:  COZAAR Take 100 mg by mouth daily.   metFORMIN 500 MG tablet Commonly known as:  GLUCOPHAGE Take 500 mg by mouth 2 (two) times daily with a meal.   oxyCODONE 5 MG immediate release tablet Commonly known as:  Oxy IR/ROXICODONE Take 1-2 tablets (5-10 mg total) by mouth every 4 (four) hours as needed for breakthrough pain.   predniSONE 10 MG (21) Tbpk tablet Commonly known as:  STERAPRED UNI-PAK 21 TAB Taper per package instructions.   simvastatin 80 MG tablet Commonly known as:  ZOCOR Take 80 mg by mouth daily at 6 PM.   SYMBICORT 160-4.5 MCG/ACT inhaler Generic drug:  budesonide-formoterol Inhale 2 puffs into the lungs 2 (two) times daily as needed.   vitamin B-12 1000 MCG tablet Commonly known as:  CYANOCOBALAMIN Take 1,000 mcg by mouth daily.            Durable Medical Equipment        Start     Ordered   08/15/17 1213  For home use only DME oxygen  Once    Question Answer Comment  Mode or  (Route) Nasal cannula   Frequency Continuous (stationary and portable oxygen unit needed)   Oxygen conserving device No   Oxygen delivery system Gas      08/15/17 1212   08/11/17 1406  DME Walker rolling  Once    Question:  Patient needs a walker to treat with the following condition  Answer:  Status post total knee replacement using cement, left   08/11/17 1406   08/11/17 1406  DME 3 n 1  Once     08/11/17 1406  08/11/17 1406  DME Bedside commode  Once    Question:  Patient needs a bedside commode to treat with the following condition  Answer:  Status post total knee replacement using cement, left   08/11/17 1406      Diagnostic Studies: Dg Chest 2 View  Result Date: 08/13/2017 CLINICAL DATA:  Cough EXAM: CHEST  2 VIEW COMPARISON:  07/28/2017 FINDINGS: Cardiac shadow is within normal limits. Aortic calcifications are noted. Mild chronic interstitial changes are noted. New left basilar atelectasis is seen. No sizable effusion is noted. Postsurgical changes in the cervical spine are again seen. IMPRESSION: Mild left basilar atelectasis. Electronically Signed   By: Alcide Clever M.D.   On: 08/13/2017 09:05   Dg Chest 2 View  Result Date: 07/28/2017 CLINICAL DATA:  Preop evaluation prior to knee surgery. EXAM: CHEST  2 VIEW COMPARISON:  Prior radiograph from 08/27/2013. FINDINGS: The cardiac and mediastinal silhouettes are stable in size and contour, and remain within normal limits. Aortic atherosclerosis. The lungs are normally inflated. No airspace consolidation, pleural effusion, or pulmonary edema is identified. There is no pneumothorax. No acute osseous abnormality identified. Dystrophic calcification overlying the upper airway is stable from previous. IMPRESSION: 1. No active cardiopulmonary disease. 2. Aortic atherosclerosis. Electronically Signed   By: Rise Mu M.D.   On: 07/28/2017 13:14   Ct Angio Chest Pe W Or Wo Contrast  Result Date: 08/13/2017 CLINICAL DATA:   Status post left knee arthroplasty August 11, 2017. Tightness in the chest. No wheezing. EXAM: CT ANGIOGRAPHY CHEST WITH CONTRAST TECHNIQUE: Multidetector CT imaging of the chest was performed using the standard protocol during bolus administration of intravenous contrast. Multiplanar CT image reconstructions and MIPs were obtained to evaluate the vascular anatomy. CONTRAST:  75 mL Isovue 370 COMPARISON:  None. FINDINGS: Cardiovascular: Satisfactory opacification of the pulmonary arteries to the segmental level. No evidence of pulmonary embolism. Normal heart size. No pericardial effusion. Mediastinum/Nodes: No enlarged hilar or axillary lymph nodes. AP window lymph node measuring 17 mm. Thyroid gland, trachea, and esophagus demonstrate no significant findings. Lungs/Pleura: Bibasilar atelectasis. No pleural effusion or pneumothorax. Upper Abdomen: No acute upper abdominal abnormality Musculoskeletal: No acute osseous abnormality osseous lesion. Review of the MIP images confirms the above findings. IMPRESSION: 1. No evidence pulmonary embolus. 2. Mild bibasilar atelectasis. 3. Mild mediastinal lymphadenopathy likely reactive. Electronically Signed   By: Elige Ko   On: 08/13/2017 10:43   Dg Knee Left Port  Result Date: 08/11/2017 CLINICAL DATA:  Postop left knee replacement EXAM: PORTABLE LEFT KNEE - 1-2 VIEW COMPARISON:  None. FINDINGS: The left knee demonstrates a total knee arthroplasty without evidence of hardware failure complication. There is a large joint effusion. There is no fracture or dislocation. The alignment is anatomic. Surgical drains are present. Post-surgical changes noted in the surrounding soft tissues. IMPRESSION: Interval left total knee arthroplasty without failure or complication. Electronically Signed   By: Elige Ko   On: 08/11/2017 13:29   Mm Screening Breast Tomo Bilateral  Result Date: 08/09/2017 CLINICAL DATA:  Screening. EXAM: 2D DIGITAL SCREENING BILATERAL MAMMOGRAM  WITH CAD AND ADJUNCT TOMO COMPARISON:  Previous exam(s). ACR Breast Density Category b: There are scattered areas of fibroglandular density. FINDINGS: There are no findings suspicious for malignancy. Images were processed with CAD. IMPRESSION: No mammographic evidence of malignancy. A result letter of this screening mammogram will be mailed directly to the patient. RECOMMENDATION: Screening mammogram in one year. (Code:SM-B-01Y) BI-RADS CATEGORY  1: Negative. Electronically Signed   By:  Edwin CapJennifer  Jarosz M.D.   On: 08/09/2017 10:12   Disposition:  Plan is for discharge home today with oral Levaquin.  Follow-up Information    Anson OregonMcGhee, Diamon Reddinger Lance, PA-C On 08/26/2017.   Specialty:  Physician Assistant Why:  [address]ely[address] [employer]aple [employer].[address] Contact information: [address] [facility]KERNOD[facility] [place] [id number] [phone]          Signed: Meriel PicaJames L Terie Lear PA-C 08/15/2017, 12:14 PM

## 2017-08-12 NOTE — Progress Notes (Signed)
Physical Therapy Treatment Patient Details Name: Valerie Solis MRN: [id number] DOB: 07/19/1951 Today's Date: 08/12/2017    History of Present Illness Pt is a 66 y.o. 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  PMH includes htn, sleep apnea, anxiety, DM type 2, lumbar and cervical surgery, h/o IBS.    PT Comments    Pt reporting not feeling very good this afternoon (in general), body felt warm (in general), and felt sore in operative knee (5/10); nursing notified.  [facility]; pt received pain meds during session.  Pt ambulated 65 feet with RW CGA and then performed toileting in bathroom (using bedside commode over toilet) with good safety and balance (CGA to SBA for safety).  Will continue to progress pt with strengthening, knee ROM, progressing ambulation distance, and attempting stairs tomorrow as appropriate.   Follow Up Recommendations  Home health PT     Equipment Recommendations  Rolling walker with 5" wheels    Recommendations for Other Services       Precautions / Restrictions Precautions Precautions: Fall;Knee Precaution Booklet Issued: Yes (comment) Restrictions Weight Bearing Restrictions: Yes LLE Weight Bearing: Weight bearing as tolerated    Mobility  Bed Mobility Overal bed mobility: Needs Assistance Bed Mobility: Sit to Supine       Sit to supine: Supervision;HOB elevated   General bed mobility comments: increased effort to bring L LE into bed on own and to reposition self in bed; SBA for safety  Transfers Overall transfer level: Needs assistance Equipment used: Rolling walker (2 wheeled) Transfers: Sit to/from Stand Sit to Stand: Min guard         General transfer comment: occasional vc's for UE and LE placement; CGA for safety  Ambulation/Gait Ambulation/Gait assistance: Min guard Ambulation Distance (Feet):  (65 feet; 25 feet (back from bathroom)) Assistive device: Rolling walker (2 wheeled) Gait  Pattern/deviations: Step-to pattern Gait velocity: decreased   General Gait Details: vc's for technique and walker use; decreased stance time L LE; vc's to increase UE support through [employer]   Stairs            Wheelchair Mobility    Modified Rankin (Stroke Patients Only)       Balance Overall balance assessment: Needs assistance Sitting-balance support: Feet supported Sitting balance-Leahy Scale: Good Sitting balance - Comments: sitting reaching within BOS   Standing balance support: No upper extremity supported Standing balance-Leahy Scale: Good Standing balance comment: standing washing hands at sink no loss of balance noted                            Cognition Arousal/Alertness: Awake/alert Behavior During Therapy: WFL for tasks assessed/performed Overall Cognitive Status: Within Functional Limits for tasks assessed                                        Exercises Total Joint Exercises Long Arc Quad: AROM;Strengthening;Left;10 reps;Seated Knee Flexion: AROM;Strengthening;Left;10 reps;Seated General Exercises - Lower Extremity Hip Flexion/Marching: AROM;Strengthening;Both;10 reps;Seated    General Comments General comments (skin integrity, edema, etc.): Polar care and L knee dressings in place.  Nursing cleared pt for participation in physical therapy.  Pt agreeable to PT session.  Pt's husband present during session.      Pertinent Vitals/Pain Pain Assessment: 0-10 Pain Score: 5  Pain Location: L knee Pain Descriptors / Indicators: Sore;Tender;Operative  site guarding Pain Intervention(s): Limited activity within patient's tolerance;Monitored during session;Repositioned;Patient requesting pain meds-RN notified;RN gave pain meds during session;Ice applied  Vitals (HR and O2 on room air) stable and WFL throughout treatment session.    Home Living                      Prior Function            PT Goals (current goals can  now be found in the care plan section) Acute Rehab PT Goals Patient Stated Goal: to go home PT Goal Formulation: With patient Time For Goal Achievement: 08/25/17 Potential to Achieve Goals: Good Additional Goals Additional Goal #1: Pt's L knee ROM 0-90 degrees. Progress towards PT goals: Progressing toward goals    Frequency    BID      PT Plan Current plan remains appropriate    Co-evaluation              AM-PAC PT "6 Clicks" Daily Activity  Outcome Measure  Difficulty turning over in bed (including adjusting bedclothes, sheets and blankets)?: A Little Difficulty moving from lying on back to sitting on the side of the bed? : A Little Difficulty sitting down on and standing up from a chair with arms (e.g., wheelchair, bedside commode, etc,.)?: Unable Help needed moving to and from a bed to chair (including a wheelchair)?: A Little Help needed walking in hospital room?: A Little Help needed climbing 3-5 steps with a railing? : A Little 6 Click Score: 16    End of Session Equipment Utilized During Treatment: Gait belt Activity Tolerance: Patient tolerated treatment well Patient left: in bed;with call bell/phone within reach;with bed alarm set;with family/visitor present;with SCD's reapplied (B heels elevated via towel rolls; polar care in place and activated) Nurse Communication: Mobility status;Precautions;Weight bearing status PT Visit Diagnosis: Other abnormalities of gait and mobility (R26.89);Muscle weakness (generalized) (M62.81);Pain Pain - Right/Left: Left Pain - part of body: Knee     Time: 1402-1440 PT Time Calculation (min) (ACUTE ONLY): 38 min  Charges:  $Gait Training: 8-22 mins $Therapeutic Exercise: 8-22 mins $Therapeutic Activity: 8-22 mins                    G CodesHendricks Limes, PT 08/12/17, 4:07 PM [phone]

## 2017-08-12 NOTE — Progress Notes (Signed)
Pt has an order for CPM however, sheepskin is unavailable. Pt aware and verbalized understanding.

## 2017-08-12 NOTE — Care Management (Signed)
Cost of Lovenox is $64.00

## 2017-08-12 NOTE — Progress Notes (Signed)
Patient offered CPM. Patient declined. Patient states that she would like to put CPM on at day break

## 2017-08-13 ENCOUNTER — Inpatient Hospital Stay: Payer: Medicare Other

## 2017-08-13 LAB — BASIC METABOLIC PANEL
Anion gap: 9 (ref 5–15)
BUN: 10 mg/dL (ref 6–20)
CHLORIDE: 96 mmol/L — AB (ref 101–111)
CO2: 27 mmol/L (ref 22–32)
Calcium: 8.8 mg/dL — ABNORMAL LOW (ref 8.9–10.3)
Creatinine, Ser: 0.68 mg/dL (ref 0.44–1.00)
GFR calc non Af Amer: >60 mL/min (ref >60)
Glucose, Bld: 167 mg/dL — ABNORMAL HIGH (ref 65–99)
POTASSIUM: 4.4 mmol/L (ref 3.5–5.1)
SODIUM: 132 mmol/L — AB (ref 135–145)

## 2017-08-13 LAB — CBC
HEMATOCRIT: 34.1 % — AB (ref 35.0–47.0)
HEMOGLOBIN: 12.1 g/dL (ref 12.0–16.0)
MCH: 32.5 pg (ref 26.0–34.0)
MCHC: 35.5 g/dL (ref 32.0–36.0)
MCV: 91.7 fL (ref 80.0–100.0)
Platelets: 162 10*3/uL (ref 150–440)
RBC: 3.72 MIL/uL — AB (ref 3.80–5.20)
RDW: 12.6 % (ref 11.5–14.5)
WBC: 10.6 10*3/uL (ref 3.6–11.0)

## 2017-08-13 LAB — GLUCOSE, CAPILLARY
GLUCOSE-CAPILLARY: 289 mg/dL — AB (ref 65–99)
GLUCOSE-CAPILLARY: 261 mg/dL — AB (ref 65–99)
Glucose-Capillary: 192 mg/dL — ABNORMAL HIGH (ref 65–99)
Glucose-Capillary: 155 mg/dL — ABNORMAL HIGH (ref 65–99)

## 2017-08-13 MED ORDER — IOPAMIDOL (ISOVUE-370) INJECTION 76%
75.0000 mL | Freq: Once | INTRAVENOUS | Status: AC | PRN
  Administered 2017-08-13: 75 mL via INTRAVENOUS

## 2017-08-13 MED ORDER — INSULIN ASPART 100 UNIT/ML ~~LOC~~ SOLN
0.0000 [IU] | Freq: Three times a day (TID) | SUBCUTANEOUS | Status: DC
  Administered 2017-08-14: 11 [IU] via SUBCUTANEOUS
  Administered 2017-08-14 – 2017-08-15 (×2): 3 [IU] via SUBCUTANEOUS
  Administered 2017-08-15: 5 [IU] via SUBCUTANEOUS
  Filled 2017-08-13 (×5): qty 1

## 2017-08-13 MED ORDER — METHYLPREDNISOLONE SODIUM SUCC 125 MG IJ SOLR
60.0000 mg | INTRAMUSCULAR | Status: DC
  Administered 2017-08-13 – 2017-08-15 (×3): 60 mg via INTRAVENOUS
  Filled 2017-08-13 (×3): qty 2

## 2017-08-13 NOTE — Progress Notes (Signed)
PT Cancellation Note  Patient Details Name: Valerie Solis MRN: [id number] DOB: 09/18/1951   Cancelled Treatment:    Reason Eval/Treat Not Completed: Other (comment)   Session held this am awaiting testing to r/o PE.  Testing negative for PE.  Discussed with primary nurse who stated pt was requesting to wait for PM for therapy as she was in increased pain and generally fatigued from multiple tests this am.     Danielle DessSarah Jarold Macomber 08/13/2017, 11:44 AM

## 2017-08-13 NOTE — Progress Notes (Addendum)
CT angio chest is negative for PE. We are treating right now for pneumonia.

## 2017-08-13 NOTE — Progress Notes (Signed)
Physical Therapy Treatment Patient Details Name: Valerie Solis MRN: [id number] DOB: 1951/04/21 Today's Date: 08/13/2017    History of Present Illness Pt is a 66 y.o. 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  Of note, pt's SpO2 readings down in the 80s during hospital stay and chest x ray confirming negative for PE but positive for PNA.  Pt is now being treated for PNA.  PMH includes htn, sleep apnea, anxiety, DM type 2, lumbar and cervical surgery, h/o IBS.   PT Comments     Valerie Solis ambulated 70 ft with RW and min guard assist.  Cues and demonstration provided for step through gait pattern which pt demonstrated back with some difficulty. Pt found with [place] off in bed and SpO2 reading 85% on RA.  Applied [place] at 2L O2 with SpO2 reading 87% with sit>stand transfer.  Increased to 4L O2 and SpO2 remained at or above 92% for remainder of session. Pt demonstrates poor safety awareness with sit<>stand transfers and requires education and cues for proper technique.  Follow up recommendations remain appropriate.  Pt will need to complete stair training prior to d/c    Follow Up Recommendations  Home health PT     Equipment Recommendations  Rolling walker with 5" wheels    Recommendations for Other Services       Precautions / Restrictions Precautions Precautions: Fall;Knee Precaution Comments: Reviewed no pillow under knee Restrictions Weight Bearing Restrictions: Yes LLE Weight Bearing: Weight bearing as tolerated    Mobility  Bed Mobility               General bed mobility comments: Pt sitting EOB upon PT arrival  Transfers Overall transfer level: Needs assistance Equipment used: Rolling walker (2 wheeled) Transfers: Sit to/from [employer] Sit to Stand: Min guard Stand pivot transfers: Min guard       General transfer comment: Pt with urgent need to void so as soon as this PT placed BSC next to bed the pt immediately stood and attempted to start  reaching for and turning to [facility] without AD.  Pt immediately provided RW and educated pt on the importance of safety with OOB activity.  At end of session pt sits in chair while maintaining BUEs on RW, leading to an uncontrolled descent to chair.  Pt instructed to stand again and education provided on safe technique which the pt then demonstrated.   Ambulation/Gait Ambulation/Gait assistance: Min guard Ambulation Distance (Feet): 70 Feet Assistive device: Rolling walker (2 wheeled) Gait Pattern/deviations: Step-to pattern;Step-through pattern;Decreased weight shift to left;Decreased stance time - left;Decreased step length - right;Antalgic;Trunk flexed Gait velocity: decreased Gait velocity interpretation: Below normal speed for age/gender General Gait Details: Pt initially with step to gait pattern and heavy reliance on WBing through BUEs.  Demonstration and cues provided for step through gait pattern which the pt is able to demonstrate back with some difficulty.  SpO2 remains at or above 92% on 4L O2 while ambulating.    Stairs            Wheelchair Mobility    Modified Rankin (Stroke Patients Only)       Balance Overall balance assessment: Needs assistance Sitting-balance support: No upper extremity supported;Feet supported Sitting balance-Valerie Solis Scale: Good     Standing balance support: Single extremity supported;During functional activity Standing balance-Valerie Solis Scale: Poor Standing balance comment: Pt hold onto RW with 1UE to wipe after toileting.  She relies on UE support in standing.  Cognition Arousal/Alertness: Awake/alert Behavior During Therapy: WFL for tasks assessed/performed Overall Cognitive Status: Within Functional Limits for tasks assessed                                        Exercises Total Joint Exercises Quad Sets: Strengthening;Left;10 reps;Seated Straight Leg Raises: Strengthening;Left;10  reps;Seated;Other (comment) (pt requires min assist to lift for first rep) Long Arc Quad: Strengthening;Left;10 reps;Seated Knee Flexion: AAROM;Left;5 reps;Seated;Other (comment) (with 5 second holds) Goniometric ROM: L knee flexion AAROM in sitting 88 deg    General Comments General comments (skin integrity, edema, etc.): Pt found with [place] off in bed and SpO2 reading 85% on RA.  Applied [place] at 2L O2 with SpO2 reading 87% with sit>stand transfer.  Increased to 4L O2 and SpO2 remained at or above 92% for remainder of session.       Pertinent Vitals/Pain Pain Assessment: Faces Faces Pain Scale: Hurts little more Pain Location: L knee Pain Descriptors / Indicators: Sore;Tender;Operative site guarding Pain Intervention(s): Limited activity within patient's tolerance;Monitored during session;Repositioned;Ice applied    Home Living                      Prior Function            PT Goals (current goals can now be found in the care plan section) Acute Rehab PT Goals Patient Stated Goal: to go home PT Goal Formulation: With patient Time For Goal Achievement: 08/25/17 Potential to Achieve Goals: Good Progress towards PT goals: Progressing toward goals    Frequency    BID      PT Plan Current plan remains appropriate    Co-evaluation              AM-PAC PT "6 Clicks" Daily Activity  Outcome Measure  Difficulty turning over in bed (including adjusting bedclothes, sheets and blankets)?: A Little Difficulty moving from lying on back to sitting on the side of the bed? : A Little Difficulty sitting down on and standing up from a chair with arms (e.g., wheelchair, bedside commode, etc,.)?: A Lot Help needed moving to and from a bed to chair (including a wheelchair)?: A Little Help needed walking in hospital room?: A Little Help needed climbing 3-5 steps with a railing? : A Little 6 Click Score: 17    End of Session Equipment Utilized During Treatment: Gait  belt;Oxygen Activity Tolerance: Patient tolerated treatment well Patient left: in chair;with call bell/phone within reach;with chair alarm set;Other (comment) (with polar care and towel roll in place) Nurse Communication: Mobility status;Other (comment) (SpO2) PT Visit Diagnosis: Other abnormalities of gait and mobility (R26.89);Muscle weakness (generalized) (M62.81);Pain Pain - Right/Left: Left Pain - part of body: Knee     Time: [id number] PT Time Calculation (min) (ACUTE ONLY): 32 min  Charges:  $Gait Training: 8-22 mins $Therapeutic Exercise: 8-22 mins                    G Codes:       Encarnacion ChuAshley Abashian PT, DPT 08/13/2017, 2:25 PM

## 2017-08-13 NOTE — Progress Notes (Signed)
Pt has home CPAP plugged into red outlet. No signs of damage to the machine. Pt states that she doesn't need O2 with CPAP

## 2017-08-13 NOTE — Progress Notes (Signed)
Pt with low O2 saturation (79-82% on RA) this AM at routine vitals check.  Denies chest pain, but describes tightness in right upper chest. Pt states she has been treated recently for sinus infection. Pt is POD 2 with left knee replacement. Pt placed on 2L oxygen via N/C, O2 increased to 92% with encouragement of deep breathing.  Paged MD. Orders placed per Dr. Joice LoftsPoggi for chest xray, hospitalist consult, and CT angio chest to R/O possible PE. Awaiting callback for consult.

## 2017-08-13 NOTE — Progress Notes (Signed)
Pt currently remains on 4L oxygen via N/C. Attempt was made to wean down, however her levels dropped to 89% on 2L. Pt denies SOB, states she "just feels congested and coughing." Up to BR and was able to complete ADL's independently with RN standing by. Pain well controlled with oxy and tylenol. Pt c/o sweating and noted with perspiration to gown and sheets. VS WNL, afebrile, no dizziness or other symptoms. Pt had low grade fever this AM, now is 98.4 orally. Will continue to monitor.

## 2017-08-13 NOTE — Progress Notes (Signed)
   Subjective: 2 Days Post-Op Procedure(s) (LRB): TOTAL KNEE ARTHROPLASTY (Left) Patient reports pain as 6 on 0-10 scale.   Patient is well but this am with low oxygen saturations and non productive cough. Chest xray shows atelectasis. CT angio for PE pending. Patients symptoms have improved. Currently doing well. Denies any CP, SOB, ABD pain. We will continue therapy today.    Objective: Vital signs in last 24 hours: Temp:  [97.8 F (36.6 C)-99.3 F (37.4 C)] 99.3 F (37.4 C) (10/19 2051) Pulse Rate:  [92] 92 (10/19 2051) Resp:  [19] 19 (10/19 2051) BP: (138-149)/(75-79) 149/79 (10/19 2051) SpO2:  [96 %] 96 % (10/19 2051)  Intake/Output from previous day: 10/19 0701 - 10/20 0700 In: 360 [P.O.:360] Out: -  Intake/Output this shift: No intake/output data recorded.   Recent Labs  08/12/17 0435 08/13/17 0354  HGB 12.3 12.1    Recent Labs  08/12/17 0435 08/13/17 0354  WBC 5.9 10.6  RBC 3.81 3.72*  HCT 35.2 34.1*  PLT 145* 162    Recent Labs  08/12/17 0435 08/13/17 0354  NA 137 132*  K 4.0 4.4  CL 103 96*  CO2 28 27  BUN 13 10  CREATININE 0.70 0.68  GLUCOSE 145* 167*  CALCIUM 8.7* 8.8*   No results for input(s): LABPT, INR in the last 72 hours.  EXAM General - Patient is Alert, Appropriate and Oriented Extremity - Neurovascular intact Sensation intact distally Intact pulses distally Dorsiflexion/Plantar flexion intact No cellulitis present Compartment soft Dressing - dressing C/D/I and scant drainage Motor Function - intact, moving foot and toes well on exam.   Past Medical History:  Diagnosis Date  . Anxiety   . Arthritis   . Diabetes mellitus without complication (HCC)   . GERD (gastroesophageal reflux disease)   . Hypercholesterolemia   . Hypertension   . IBS (irritable bowel syndrome)   . Sleep apnea    OSA--USE C-PAP  . Thyroid disease     Assessment/Plan:   2 Days Post-Op Procedure(s) (LRB): TOTAL KNEE ARTHROPLASTY  (Left) Active Problems:   Status post total knee replacement using cement, left  Estimated body mass index is 35.35 kg/m as calculated from the following:   Height as of this encounter: 5\' 6"  (1.676 m).   Weight as of this encounter: 99.3 kg (219 lb). Advance diet Up with therapy  Needs BM Labs stable Encouraged incentive spirometer Internal medicine consulted - CT angio chest for PE pending.  Discussed CPM with patient. She is willing to try CPM. Will start CPM pending CT results. Plan on discharge to home tomorrow with HHPT  DVT Prophylaxis - Lovenox, Foot Pumps and TED hose Weight-Bearing as tolerated to left leg   T. [place] Neighborhris Adriannah Steinkamp, PA-C [facility] 08/13/2017, 8:52 AM

## 2017-08-13 NOTE — H&P (Signed)
Patient Demographics  Valerie Solis, is a 66 y.o. female   MRN: [id number]   DOB - [birth year]  Admit Date - 08/11/2017    Outpatient Primary MD for the patient is Lynnea Ferrier, MD  Consult requested in the Hospital by Katha Hamming, MD, On 08/13/2017    Reason for consult ;hypoxia History of present illness: 66 year old female patient admitted on October 18by orthopedics because of left knee arthroplasty. Patient had surgery on October 18. She is doing well till today, today she had hypoxia with oxygen saturation,dropped to upper 80s on room air. 70 are called to evaluate for hypoxia. Patient has been having cough before the operation itself, today she had tightness in the chest. No wheezing but she had diminished breath sounds bilaterally. has Some cough but no phlegm. Has low-grade temperature. CT chest is ordered but it is pending. Chest x-ray showed possible pneumonia and left-sided.  With History of -  Past Medical History:  Diagnosis Date  . Anxiety   . Arthritis   . Diabetes mellitus without complication (HCC)   . GERD (gastroesophageal reflux disease)   . Hypercholesterolemia   . Hypertension   . IBS (irritable bowel syndrome)   . Sleep apnea    OSA--USE C-PAP  . Thyroid disease       Past Surgical History:  Procedure Laterality Date  . ABDOMINAL HYSTERECTOMY    . BACK SURGERY    . BUNIONECTOMY Bilateral   . DILATION AND CURETTAGE OF UTERUS    . NECK SURGERY  2003   Cervical Fusion  . TONSILLECTOMY    . TOTAL KNEE ARTHROPLASTY Left 08/11/2017   Procedure: TOTAL KNEE ARTHROPLASTY;  Surgeon: Christena Flake, MD;  Location: [facility] ORS;  Service: Orthopedics;  Laterality: Left;          Review of Systems    In addition to the HPI above,  No Fever-chills, No Headache, No changes with  Vision or hearing, No problems swallowing food or Liquids Cough, tightness in chest , No Abdominal pain, No Nausea or Vommitting, Bowel movements are regular, No Blood in stool or Urine, No dysuria, No new skin rashes or bruises, No new joints pains-aches,  No new weakness, tingling, numbness in any extremity, No recent weight gain or loss, No polyuria, polydypsia or polyphagia, No significant Mental Stressors.  A full 10 point Review of Systems was done, except as stated above, all other Review of Systems were negative. Left knee pain present but well controlled with pain medicines.  Social History   Family History Family History  Problem Relation Age of Onset  . Breast cancer Maternal Aunt 71  . Diabetes Maternal Aunt   . Hypertension Mother   . CVA Mother   . CVA Father      Prior to Admission medications   Medication Sig Start Date End Date Taking? Authorizing Provider  azithromycin (ZITHROMAX) 250 MG tablet Take by mouth daily.   Yes [provider]  benzonatate (TESSALON) 200 MG capsule Take  200 mg by mouth 3 (three) times daily as needed for cough.   Yes [provider]  budesonide-formoterol (SYMBICORT) 160-4.5 MCG/ACT inhaler Inhale 2 puffs into the lungs 2 (two) times daily as needed. 01/28/15  Yes [provider]  Coenzyme Q-10 100 MG capsule Take 1 capsule by mouth at bedtime.    Yes [provider]  cyclobenzaprine (FLEXERIL) 10 MG tablet Take 10 mg by mouth 2 (two) times daily as needed for muscle spasms.   Yes [provider]  DULoxetine (CYMBALTA) 60 MG capsule Take 60 mg by mouth daily. 02/17/16  Yes [provider]  famotidine (PEPCID) 20 MG tablet Take 40 mg by mouth 2 (two) times daily.    Yes [provider]  hyoscyamine (LEVSIN SL) 0.125 MG SL tablet Take 0.125 mg by mouth every 6 (six) hours as needed.  09/27/16  Yes [provider]  levothyroxine (SYNTHROID, LEVOTHROID) 125 MCG tablet  Take 125 mcg by mouth daily before breakfast.  09/10/16  Yes [provider]  losartan (COZAAR) 100 MG tablet Take 100 mg by mouth daily. 08/11/16  Yes [provider]  Omega-3 Fatty Acids (FISH OIL OMEGA-3 PO) Take 2 capsules by mouth 2 (two) times daily.   Yes [provider]  simvastatin (ZOCOR) 80 MG tablet Take 80 mg by mouth daily at 6 PM.  02/17/16  Yes [provider]  vitamin B-12 (CYANOCOBALAMIN) 1000 MCG tablet Take 1,000 mcg by mouth daily.    Yes [provider]  albuterol (PROVENTIL HFA;VENTOLIN HFA) 108 (90 Base) MCG/ACT inhaler Inhale 2 puffs into the lungs every 4 (four) hours as needed. 01/28/15   [provider]  enoxaparin (LOVENOX) 40 MG/0.4ML injection Inject 0.4 mLs (40 mg total) into the skin daily. 08/12/17   Anson OregonMcGhee, James Lance, PA-C  metFORMIN (GLUCOPHAGE) 500 MG tablet Take 500 mg by mouth 2 (two) times daily with a meal. 09/27/16 09/27/17  [provider]  oxyCODONE (OXY IR/ROXICODONE) 5 MG immediate release tablet Take 1-2 tablets (5-10 mg total) by mouth every 4 (four) hours as needed for breakthrough pain. 08/12/17   Anson OregonMcGhee, James Lance, PA-C    Anti-infectives    Start     Dose/Rate Route Frequency Ordered Stop   08/11/17 2200  vancomycin (VANCOCIN) IVPB 1000 mg/200 mL premix     1,000 mg 200 mL/hr over 60 Minutes Intravenous Every 12 hours 08/11/17 1406 08/12/17 0100   08/10/17 2245  vancomycin (VANCOCIN) IVPB 1000 mg/200 mL premix     1,000 mg 200 mL/hr over 60 Minutes Intravenous  Once 08/10/17 2241 08/11/17 1041      Scheduled Meds: . atorvastatin  40 mg Oral q1800  . Chlorhexidine Gluconate Cloth  6 each Topical Q0600  . docusate sodium  100 mg Oral BID  . DULoxetine  60 mg Oral Daily  . enoxaparin (LOVENOX) injection  40 mg Subcutaneous Q24H  . levothyroxine  125 mcg Oral QAC breakfast  . losartan  100 mg Oral Daily  . mometasone-formoterol  2 puff Inhalation BID  . mupirocin ointment  1  application Nasal BID  . omega-3 acid ethyl esters  1 capsule Oral BID  . pantoprazole  40 mg Oral BID  . vitamin B-12  1,000 mcg Oral Daily   Continuous Infusions: . 0.9 % NaCl with KCl 20 mEq / L 75 mL/hr at 08/11/17 1603   PRN Meds:.acetaminophen **OR** acetaminophen, albuterol, benzonatate, bisacodyl, cyclobenzaprine, diphenhydrAMINE, HYDROmorphone (DILAUDID) injection, hyoscyamine, magnesium hydroxide, metoCLOPramide **OR** metoCLOPramide (REGLAN)  injection, ondansetron **OR** ondansetron (ZOFRAN) IV, oxyCODONE, sodium phosphate  Allergies  Allergen Reactions  . Ace Inhibitors Cough  . Sulfa Antibiotics Rash    Physical Exam  Vitals  Blood pressure (!) 149/79, pulse 92, temperature 99.3 F (37.4 C), temperature source Oral, resp. rate 19, height 5\' 6"  (1.676 m), weight 99.3 kg (219 lb), SpO2 96 %.   1. General alert, awake lying in bed in NAD,    2. Normal affect and insight, Not Suicidal or Homicidal, Awake Alert, Oriented X 3.  3. No F.N deficits, ALL C.Nerves Intact, Strength 5/5 all 4 extremities, Sensation intact all 4 extremities, Plantars down going.  4. Ears and Eyes appear Normal, Conjunctivae clear, PERRLA. Moist Oral Mucosa.  5. Supple Neck, No JVD, No cervical lymphadenopathy appriciated, No Carotid Bruits.  6. Decreased breath sounds bilaterally, no wheezing or rales.  7. RRR, No Gallops, Rubs or Murmurs, No Parasternal Heave.  8. Positive Bowel Sounds, Abdomen Soft, No tenderness, No organomegaly appriciated,No rebound -guarding or rigidity.  9.  No Cyanosis, Normal Skin Turgor, No Skin Rash or Bruise.  10. Good muscle tone,  joints appear normal , no effusions, Normal ROM.  11. No Palpable Lymph Nodes in Neck or Axillae    Data Review  CBC  Recent Labs Lab 08/12/17 0435 08/13/17 0354  WBC 5.9 10.6  HGB 12.3 12.1  HCT 35.2 34.1*  PLT 145* 162  MCV 92.3 91.7  MCH 32.3 32.5  MCHC 35.0 35.5  RDW 12.5 12.6  LYMPHSABS 1.1  --   MONOABS  0.6  --   EOSABS 0.2  --   BASOSABS 0.1  --    ------------------------------------------------------------------------------------------------------------------  Chemistries   Recent Labs Lab 08/12/17 0435 08/13/17 0354  NA 137 132*  K 4.0 4.4  CL 103 96*  CO2 28 27  GLUCOSE 145* 167*  BUN 13 10  CREATININE 0.70 0.68  CALCIUM 8.7* 8.8*   ------------------------------------------------------------------------------------------------------------------ estimated creatinine clearance is 83.3 mL/min (by C-G formula based on SCr of 0.68 mg/dL). ------------------------------------------------------------------------------------------------------------------ No results for input(s): TSH, T4TOTAL, T3FREE, THYROIDAB in the last 72 hours.  Invalid input(s): FREET3   Coagulation profile No results for input(s): INR, PROTIME in the last 168 hours. ------------------------------------------------------------------------------------------------------------------- No results for input(s): DDIMER in the last 72 hours. -------------------------------------------------------------------------------------------------------------------  Cardiac Enzymes No results for input(s): CKMB, TROPONINI, MYOGLOBIN in the last 168 hours.  Invalid input(s): CK ------------------------------------------------------------------------------------------------------------------ Invalid input(s): POCBNP   ---------------------------------------------------------------------------------------------------------------  Urinalysis    Component Value Date/Time   COLORURINE STRAW (A) 07/27/2017 0932   APPEARANCEUR CLEAR (A) 07/27/2017 0932   LABSPEC 1.006 07/27/2017 0932   PHURINE 6.0 07/27/2017 0932   GLUCOSEU NEGATIVE 07/27/2017 0932   HGBUR NEGATIVE 07/27/2017 0932   BILIRUBINUR NEGATIVE 07/27/2017 0932   KETONESUR NEGATIVE 07/27/2017 0932   PROTEINUR NEGATIVE 07/27/2017 0932   NITRITE NEGATIVE  07/27/2017 0932   LEUKOCYTESUR TRACE (A) 07/27/2017 0932     Imaging results:   Dg Chest 2 View  Result Date: 08/13/2017 CLINICAL DATA:  Cough EXAM: CHEST  2 VIEW COMPARISON:  07/28/2017 FINDINGS: Cardiac shadow is within normal limits. Aortic calcifications are noted. Mild chronic interstitial changes are noted. New left basilar atelectasis is seen. No sizable effusion is noted. Postsurgical changes in the cervical spine are again seen. IMPRESSION: Mild left basilar atelectasis. Electronically Signed   By: Alcide Clever M.D.   On: 08/13/2017 09:05   Dg Knee Left Port  Result Date: 08/11/2017 CLINICAL DATA:  Postop left knee replacement EXAM: PORTABLE LEFT  KNEE - 1-2 VIEW COMPARISON:  None. FINDINGS: The left knee demonstrates a total knee arthroplasty without evidence of hardware failure complication. There is a large joint effusion. There is no fracture or dislocation. The alignment is anatomic. Surgical drains are present. Post-surgical changes noted in the surrounding soft tissues. IMPRESSION: Interval left total knee arthroplasty without failure or complication. Electronically Signed   By: Elige Ko   On: 08/11/2017 13:29       Assessment & Plan  Active Problems:   Status post total knee replacement using cement, left   Left knee pain status post left total knee replacent, continue pain medicines, DVT prophylaxis;patient is  encouragedto use incentive spirometry.  #2 acute respiratory failure with hypoxia after the knee surgery, CT and the chest is ordered  to evaluate for PE Chest x-ray concerning for left-sided pneumonia: Continue antibiotics, bronchodilators, discussed with orthopedic.patient is on 4 L of oxygen, saturation is in the upper 90s on 4 L;Continue oxygen..  #3 diabetes mellitus type 2;continue surgical with coverage, restart metformin at discharge #4.l sleep apnea #5essential hypertension;controlled, continue losartan, #6 history of asthma: Patient to the  takes albuterol inha Symbicort at home. Resume bronchodilators, antibiotics, start small dose steroids #7. history of depression: Continue her duloxetine. Time spent;55 min  DVT Prophylaxis;lovenox  AM Labs Ordered, also please review Full Orders  Family Communication: Plan discussed with patient.   Thank you for the consult, we will follow the patient with you in the Hospital.   [facility] M.D on 08/13/2017 at 10:15 AM  Note: This dictation was prepared with Dragon dictation along with smaller phrase technology. Any transcriptional errors that result from this process are unintentional.

## 2017-08-13 NOTE — Progress Notes (Signed)
Patient offered CPM machine again this morning. Patient refused.

## 2017-08-13 NOTE — Anesthesia Postprocedure Evaluation (Signed)
Anesthesia Post Note  Patient: Valerie Solis  Procedure(s) Performed: TOTAL KNEE ARTHROPLASTY (Left )  Patient location during evaluation: PACU Anesthesia Type: Spinal Level of consciousness: oriented and awake and alert Pain management: pain level controlled Vital Signs Assessment: post-procedure vital signs reviewed and stable Respiratory status: spontaneous breathing, respiratory function stable and patient connected to nasal cannula oxygen Cardiovascular status: blood pressure returned to baseline and stable Postop Assessment: no headache, no backache and no apparent nausea or vomiting Anesthetic complications: no     Last Vitals:  Vitals:   08/12/17 1413 08/12/17 2051  BP:  (!) 149/79  Pulse:  92  Resp:  19  Temp: 36.6 C 37.4 C  SpO2:  96%    Last Pain:  Vitals:   08/12/17 2200  TempSrc:   PainSc: 5                  [name] S

## 2017-08-14 LAB — BASIC METABOLIC PANEL
Anion gap: 8 (ref 5–15)
BUN: 16 mg/dL (ref 6–20)
CALCIUM: 9.7 mg/dL (ref 8.9–10.3)
CHLORIDE: 97 mmol/L — AB (ref 101–111)
CO2: 27 mmol/L (ref 22–32)
CREATININE: 0.75 mg/dL (ref 0.44–1.00)
GFR calc Af Amer: >60 mL/min (ref >60)
GFR calc non Af Amer: >60 mL/min (ref >60)
GLUCOSE: 231 mg/dL — AB (ref 65–99)
Potassium: 4.8 mmol/L (ref 3.5–5.1)
Sodium: 132 mmol/L — ABNORMAL LOW (ref 135–145)

## 2017-08-14 LAB — GLUCOSE, CAPILLARY
GLUCOSE-CAPILLARY: 185 mg/dL — AB (ref 65–99)
Glucose-Capillary: 184 mg/dL — ABNORMAL HIGH (ref 65–99)
Glucose-Capillary: 341 mg/dL — ABNORMAL HIGH (ref 65–99)
Glucose-Capillary: 361 mg/dL — ABNORMAL HIGH (ref 65–99)

## 2017-08-14 MED ORDER — LEVOFLOXACIN IN D5W 750 MG/150ML IV SOLN
750.0000 mg | Freq: Every day | INTRAVENOUS | Status: DC
  Administered 2017-08-14 – 2017-08-15 (×2): 750 mg via INTRAVENOUS
  Filled 2017-08-14 (×2): qty 150

## 2017-08-14 NOTE — Progress Notes (Signed)
[facility] [place] [facility]   PATIENT NAME: Valerie Solis    MR#:  [id number]  DATE OF BIRTH:  11/04/1950  SUBJECTIVE:admitted for left knee arthritis,medicine  consulted for hypoxia yesterday.Patient has lots of cough, found to have pneumonia on x-ray, ct angio chest is negative for PE.,.she does have e. She says she's feeling a lite better. Started and bronchodilators, IV steroids yesterday Advised to use, incentive spirometry, started on levaquin today.,  CHIEF COMPLAINT:  No chief complaint on file.   REVIEW OF SYSTEMS:   ROS CONSTITUTIONAL: No fever, fatigue or weakness.  EYES: No blurred or double vision.  EARS, NOSE, AND THROAT: No tinnitus or ear pain.  RESPIRATORY;patient still has dry cough, no shortness of breath. CARDIOVASCULAR: No chest pain, orthopnea, edema.  GASTROINTESTINAL: No nausea, vomiting, diarrhea or abdominal pain.  GENITOURINARY: No dysuria, hematuria.  ENDOCRINE: No polyuria, nocturia,  HEMATOLOGY: No anemia, easy bruising or bleeding SKIN: No rash or lesion. MUSCULOSKELETAL:   Eft knee dressing intact. Scant drainage. NEUROLOGIC: No tingling, numbness, weakness.  PSYCHIATRY: No anxiety or depression.   DRUG ALLERGIES:   Allergies  Allergen Reactions  . Ace Inhibitors Cough  . Sulfa Antibiotics Rash    VITALS:  Blood pressure 138/68, pulse 96, temperature 97.9 F (36.6 C), temperature source Oral, resp. rate 19, height 5\' 6"  (1.676 m), weight 99.3 kg (219 lb), SpO2 92 %.  PHYSICAL EXAMINATION:  GENERAL:  66 y.o.-year-old patient lying in the bed with no acute distress.  EYES: Pupils equal, round, reactive to light, accommodation. No scleral icterus. Extraocular muscles intact.  HEENT: Head atraumatic, normocephalic. Oropharynx and nasopharynx clear.  NECK:  Supple, no jugular venous distention. No thyroid enlargement, no tenderness.  LUNGS: diminished breath sounds bilaterally. No wheezing. CARDIOVASCULAR: S1,  S2 normal. No murmurs, rubs, or gallops.  ABDOMEN: Soft, nontender, nondistended. Bowel sounds present. No organomegaly or mass.  EXTREMITIES: No pedal edema, cyanosis, or clubbing.  NEUROLOGIC: Cranial nerves II through XII are intact. Muscle strength 5/5 in all extremities. Sensation intact. Gait not checked.  PSYCHIATRIC: The patient is alert and oriented x 3.  SKIN: No obvious rash, lesion, or ulcer.    LABORATORY PANEL:   CBC  Recent Labs Lab 08/13/17 0354  WBC 10.6  HGB 12.1  HCT 34.1*  PLT 162   ------------------------------------------------------------------------------------------------------------------  Chemistries   Recent Labs Lab 08/14/17 0342  NA 132*  K 4.8  CL 97*  CO2 27  GLUCOSE 231*  BUN 16  CREATININE 0.75  CALCIUM 9.7   ------------------------------------------------------------------------------------------------------------------  Cardiac Enzymes No results for input(s): TROPONINI in the last 168 hours. ------------------------------------------------------------------------------------------------------------------  RADIOLOGY:  Dg Chest 2 View  Result Date: 08/13/2017 CLINICAL DATA:  Cough EXAM: CHEST  2 VIEW COMPARISON:  07/28/2017 FINDINGS: Cardiac shadow is within normal limits. Aortic calcifications are noted. Mild chronic interstitial changes are noted. New left basilar atelectasis is seen. No sizable effusion is noted. Postsurgical changes in the cervical spine are again seen. IMPRESSION: Mild left basilar atelectasis. Electronically Signed   By: Alcide CleverMark  Lukens M.D.   On: 08/13/2017 09:05   Ct Angio Chest Pe W Or Wo Contrast  Result Date: 08/13/2017 CLINICAL DATA:  Status post left knee arthroplasty August 11, 2017. Tightness in the chest. No wheezing. EXAM: CT ANGIOGRAPHY CHEST WITH CONTRAST TECHNIQUE: Multidetector CT imaging of the chest was performed using the standard protocol during bolus administration of intravenous contrast.  Multiplanar CT image reconstructions and MIPs were obtained to evaluate the vascular anatomy.  CONTRAST:  75 mL Isovue 370 COMPARISON:  None. FINDINGS: Cardiovascular: Satisfactory opacification of the pulmonary arteries to the segmental level. No evidence of pulmonary embolism. Normal heart size. No pericardial effusion. Mediastinum/Nodes: No enlarged hilar or axillary lymph nodes. AP window lymph node measuring 17 mm. Thyroid gland, trachea, and esophagus demonstrate no significant findings. Lungs/Pleura: Bibasilar atelectasis. No pleural effusion or pneumothorax. Upper Abdomen: No acute upper abdominal abnormality Musculoskeletal: No acute osseous abnormality osseous lesion. Review of the MIP images confirms the above findings. IMPRESSION: 1. No evidence pulmonary embolus. 2. Mild bibasilar atelectasis. 3. Mild mediastinal lymphadenopathy likely reactive. Electronically Signed   By: Elige Ko   On: 08/13/2017 10:43    EKG:   Orders placed or performed during the hospital encounter of 07/27/17  . EKG 12-Lead  . EKG 12-Lead    ASSESSMENT AND PLAN:   1, acute respiratory failure with hypoxia.after left knee surgery. Oxygen saturation was in the mid 70s yesterday as per registered nurse. Medicine is consulted for this. CT of the chest is negative for PE. It showed bilateral basilar atelectasis ;continue oxygen, bronchodilators, started on low-dose IV steroids because of her history of asthma , dry cough.Continue oxygen, today she is saturating more than 90% on 3 L. Continue to finish 5 day course of Levaquin at 500 mg daily. #2. diabetes mellitus type 2: hold metformin due to contrast that she received yesterday. Ut the DVT prophylaxis, GI prophylaxis.  All the records are reviewed and case discussed with Care Management/Social Workerr. Management plans discussed with the patient, family and they are in agreement.  CODE STATUS: full code  TOTAL TIME TAKING CARE OF THIS PATIENT: 35 minutes.    POSSIBLE D/C IN 1-2 DAYS, DEPENDING ON CLINICAL CONDITION.   Katha Hamming M.D on 08/14/2017 at 10:20 AM  Between 7am to 6pm - Pager - [phone]  After 6pm go to [email or web address] - password [email or web address] [facility]  [facility]  Office  [phone]  CC: Primary care physician; Lynnea Ferrier, MD   Note: This dictation was prepared with Dragon dictation along with smaller phrase technology. Any transcriptional errors that result from this process are unintentional.

## 2017-08-14 NOTE — Progress Notes (Signed)
Pt experienced facial flushing after levaquin infusion. VS stable, no itching or SOB. Will continue to monitor.

## 2017-08-14 NOTE — Progress Notes (Signed)
Suppository administered

## 2017-08-14 NOTE — Progress Notes (Signed)
   Subjective: 3 Days Post-Op Procedure(s) (LRB): TOTAL KNEE ARTHROPLASTY (Left) Patient reports pain as mild. Pain is improving.   Patient is being treated for PNA. Oxygen saturations improving, currently down to 2 L via [place]. CTA chest negative for PE.  Denies any CP, SOB, ABD pain. We will continue therapy today.    Objective: Vital signs in last 24 hours: Temp:  [97.9 F (36.6 C)-98.4 F (36.9 C)] 97.9 F (36.6 C) (10/20 2007) Pulse Rate:  [83-96] 96 (10/20 2007) Resp:  [19] 19 (10/20 1605) BP: (132-138)/(65-68) 138/68 (10/20 2007) SpO2:  [89 %-92 %] 92 % (10/20 2007)  Intake/Output from previous day: No intake/output data recorded. Intake/Output this shift: No intake/output data recorded.   Recent Labs  08/12/17 0435 08/13/17 0354  HGB 12.3 12.1    Recent Labs  08/12/17 0435 08/13/17 0354  WBC 5.9 10.6  RBC 3.81 3.72*  HCT 35.2 34.1*  PLT 145* 162    Recent Labs  08/13/17 0354 08/14/17 0342  NA 132* 132*  K 4.4 4.8  CL 96* 97*  CO2 27 27  BUN 10 16  CREATININE 0.68 0.75  GLUCOSE 167* 231*  CALCIUM 8.8* 9.7   No results for input(s): LABPT, INR in the last 72 hours.  EXAM General - Patient is Alert, Appropriate and Oriented  Lungs - CTA no wheezing. Good air movement bilaterally Extremity - Neurovascular intact Sensation intact distally Intact pulses distally Dorsiflexion/Plantar flexion intact No cellulitis present Compartment soft Dressing - dressing C/D/I and scant drainage Motor Function - intact, moving foot and toes well on exam.   Past Medical History:  Diagnosis Date  . Anxiety   . Arthritis   . GERD (gastroesophageal reflux disease)   . Hypercholesterolemia   . Hypertension   . IBS (irritable bowel syndrome)   . Sleep apnea    OSA--USE C-PAP  . Thyroid disease     Assessment/Plan:   3 Days Post-Op Procedure(s) (LRB): TOTAL KNEE ARTHROPLASTY (Left) Active Problems:   Status post total knee replacement using cement,  left  Estimated body mass index is 35.35 kg/m as calculated from the following:   Height as of this encounter: 5\' 6"  (1.676 m).   Weight as of this encounter: 99.3 kg (219 lb). Advance diet Up with therapy  Needs BM Labs stable Encouraged incentive spirometer Internal medicine consulted - continue treatment for PNA. IV levaquin followed by po levaquin.  Continue to wean off O2, currently down to 2 liters via [place] Plan on discharge to home tomorrow with HHPT  DVT Prophylaxis - Lovenox, Foot Pumps and TED hose Weight-Bearing as tolerated to left leg   T. [place] Neighborhris Gaines, PA-C [facility] 08/14/2017, 8:23 AM

## 2017-08-14 NOTE — Progress Notes (Signed)
Physical Therapy Treatment Patient Details Name: Valerie Solis MRN: [id number] DOB: 12/02/1950 Today's Date: 08/14/2017    History of Present Illness Pt is a 66 y.o. 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  Of note, pt's SpO2 readings down in the 80s during hospital stay and chest x ray confirming negative for PE but positive for PNA.  Pt is now being treated for PNA.  PMH includes htn, sleep apnea, anxiety, DM type 2, lumbar and cervical surgery, h/o IBS.    PT Comments    Valerie Solis made excellent progress with mobility.  She completed stair training.  She does continue to require some cues for safety with transfers.  Pt will benefit from continued skilled PT services to increase functional independence and safety.    Follow Up Recommendations  Home health PT     Equipment Recommendations  Rolling walker with 5" wheels    Recommendations for Other Services       Precautions / Restrictions Precautions Precautions: Fall;Knee Precaution Comments: Pt able to recall no pillow under knee Restrictions Weight Bearing Restrictions: Yes LLE Weight Bearing: Weight bearing as tolerated    Mobility  Bed Mobility Overal bed mobility: Modified Independent Bed Mobility: Supine to Sit     Supine to sit: Modified independent (Device/Increase time)     General bed mobility comments: Slightly increased time but no physical assist or cues needed.   Transfers Overall transfer level: Needs assistance Equipment used: Rolling walker (2 wheeled) Transfers: Sit to/from Stand Sit to Stand: Supervision         General transfer comment: Pt again sits with BUEs on RW and requires cues for proper technique.  On second trial the pt performs correctly without cues needed.  Supervision for safety  Ambulation/Gait Ambulation/Gait assistance: Min guard Ambulation Distance (Feet): 150 Feet Assistive device: Rolling walker (2 wheeled) Gait Pattern/deviations: Step-to pattern;Step-through  pattern;Decreased weight shift to left;Decreased stance time - left;Decreased step length - right;Antalgic;Trunk flexed Gait velocity: decreased Gait velocity interpretation: Below normal speed for age/gender General Gait Details: Pt demonstrates step through gait pattern without cues.  Cues for forward gaze.  Slower but steady gait.    Stairs Stairs: Yes   Stair Management: Two rails;Step to pattern;Forwards Number of Stairs: 4 General stair comments: Demonstration and cues for proper technique. Pt performs with supervision for safety.   Wheelchair Mobility    Modified Rankin (Stroke Patients Only)       Balance Overall balance assessment: Needs assistance Sitting-balance support: No upper extremity supported;Feet supported Sitting balance-Leahy Scale: Good     Standing balance support: Single extremity supported;During functional activity Standing balance-Leahy Scale: Poor Standing balance comment: Pt relies on UE support for static and dynamic activities                            Cognition Arousal/Alertness: Awake/alert Behavior During Therapy: WFL for tasks assessed/performed Overall Cognitive Status: Within Functional Limits for tasks assessed                                        Exercises Total Joint Exercises Ankle Circles/Pumps: AROM;Both;10 reps;Supine Quad Sets: Strengthening;10 reps;Seated;Both Straight Leg Raises: Strengthening;Left;10 reps;Seated;Other (comment) (pt requires min assist to lift for first rep) Long Arc Quad: Strengthening;Left;10 reps;Seated Knee Flexion: AAROM;Left;Seated;10 reps Goniometric ROM: L knee flexion AAROM in sitting 83 deg  General Comments General comments (skin integrity, edema, etc.): SpO2 remained at or above 92% on 2L O2 throughout session.       Pertinent Vitals/Pain Pain Assessment: Faces Faces Pain Scale: Hurts little more Pain Location: L knee Pain Descriptors / Indicators:  Sore;Tender;Operative site guarding Pain Intervention(s): Limited activity within patient's tolerance;Monitored during session;Repositioned;Ice applied    Home Living                      Prior Function            PT Goals (current goals can now be found in the care plan section) Acute Rehab PT Goals Patient Stated Goal: to go home PT Goal Formulation: With patient Time For Goal Achievement: 08/25/17 Potential to Achieve Goals: Good Progress towards PT goals: Progressing toward goals    Frequency    BID      PT Plan Current plan remains appropriate    Co-evaluation              AM-PAC PT "6 Clicks" Daily Activity  Outcome Measure  Difficulty turning over in bed (including adjusting bedclothes, sheets and blankets)?: None Difficulty moving from lying on back to sitting on the side of the bed? : None Difficulty sitting down on and standing up from a chair with arms (e.g., wheelchair, bedside commode, etc,.)?: A Little Help needed moving to and from a bed to chair (including a wheelchair)?: A Little Help needed walking in hospital room?: A Little Help needed climbing 3-5 steps with a railing? : A Little 6 Click Score: 20    End of Session Equipment Utilized During Treatment: Gait belt;Oxygen Activity Tolerance: Patient tolerated treatment well Patient left: in chair;with call bell/phone within reach;with chair alarm set;Other (comment) (with polar care and towel roll in place) Nurse Communication: Mobility status;Other (comment) (SpO2) PT Visit Diagnosis: Other abnormalities of gait and mobility (R26.89);Muscle weakness (generalized) (M62.81);Pain Pain - Right/Left: Left Pain - part of body: Knee     Time: 6962-9528 PT Time Calculation (min) (ACUTE ONLY): 33 min  Charges:  $Gait Training: 23-37 mins                    G Codes:       Encarnacion Chu PT, DPT 08/14/2017, 12:13 PM

## 2017-08-15 LAB — GLUCOSE, CAPILLARY
Glucose-Capillary: 154 mg/dL — ABNORMAL HIGH (ref 65–99)
Glucose-Capillary: 202 mg/dL — ABNORMAL HIGH (ref 65–99)

## 2017-08-15 MED ORDER — PREDNISONE 10 MG (21) PO TBPK: ORAL_TABLET | ORAL | 0 refills | Status: AC

## 2017-08-15 MED ORDER — LEVOFLOXACIN 750 MG PO TABS: 750.0000 mg | ORAL_TABLET | Freq: Every day | ORAL | 0 refills | Status: AC

## 2017-08-15 NOTE — Progress Notes (Signed)
[facility] [place] [facility]   PATIENT NAME: Valerie Solis    MR#:  [id number]  DATE OF BIRTH:  09/29/1951  Patient's cough and shortness of breath is better. Weaning off oxygen. Okay to discharge patient from a medical standpoint with by mouth levofloxacin and steroid Dosepak, discussed with orthopedics PA  REVIEW OF SYSTEMS:   ROS CONSTITUTIONAL: No fever, fatigue or weakness.  EYES: No blurred or double vision.  EARS, NOSE, AND THROAT: No tinnitus or ear pain.  RESPIRATORY;patient still has dry cough, no shortness of breath. CARDIOVASCULAR: No chest pain, orthopnea, edema.  GASTROINTESTINAL: No nausea, vomiting, diarrhea or abdominal pain.  GENITOURINARY: No dysuria, hematuria.  ENDOCRINE: No polyuria, nocturia,  HEMATOLOGY: No anemia, easy bruising or bleeding SKIN: No rash or lesion. MUSCULOSKELETAL:   Eft knee dressing intact. Scant drainage. NEUROLOGIC: No tingling, numbness, weakness.  PSYCHIATRY: No anxiety or depression.   DRUG ALLERGIES:   Allergies  Allergen Reactions  . Ace Inhibitors Cough  . Sulfa Antibiotics Rash    VITALS:  Blood pressure 130/73, pulse 84, temperature 98.1 F (36.7 C), temperature source Oral, resp. rate 18, height 5\' 6"  (1.676 m), weight 99.3 kg (219 lb), SpO2 93 %.  PHYSICAL EXAMINATION:  GENERAL:  66 y.o.-year-old patient lying in the bed with no acute distress.  EYES: Pupils equal, round, reactive to light, accommodation. No scleral icterus. Extraocular muscles intact.  HEENT: Head atraumatic, normocephalic. Oropharynx and nasopharynx clear.  NECK:  Supple, no jugular venous distention. No thyroid enlargement, no tenderness.  LUNGS: Coarse breath sounds bilaterally. No wheezing. CARDIOVASCULAR: S1, S2 normal. No murmurs, rubs, or gallops.  ABDOMEN: Soft, nontender, nondistended. Bowel sounds present. No organomegaly or mass.  EXTREMITIES: No pedal edema, cyanosis, or clubbing.  NEUROLOGIC: Cranial  nerves II through XII are intact. Muscle strength 5/5 in all extremities. Sensation intact. Gait not checked.  PSYCHIATRIC: The patient is alert and oriented x 3.  SKIN: No obvious rash, lesion, or ulcer.    LABORATORY PANEL:   CBC  Recent Labs Lab 08/13/17 0354  WBC 10.6  HGB 12.1  HCT 34.1*  PLT 162   ------------------------------------------------------------------------------------------------------------------  Chemistries   Recent Labs Lab 08/14/17 0342  NA 132*  K 4.8  CL 97*  CO2 27  GLUCOSE 231*  BUN 16  CREATININE 0.75  CALCIUM 9.7   ------------------------------------------------------------------------------------------------------------------  Cardiac Enzymes No results for input(s): TROPONINI in the last 168 hours. ------------------------------------------------------------------------------------------------------------------  RADIOLOGY:  No results found.  EKG:   Orders placed or performed during the hospital encounter of 07/27/17  . EKG 12-Lead  . EKG 12-Lead    ASSESSMENT AND PLAN:   1, acute respiratory failure with hypoxia.after left knee surgery-secondary acute bronchitis and clinical pneumonia  Clinically improving Antitussives as needed Discharge home with by mouth levofloxacin and steroid Dosepak Wean off oxygen   #2. diabetes mellitus type 2: hold metformin due to contrast that she received yesterday.  Ut the DVT prophylaxis, GI prophylaxis.  All the records are reviewed and case discussed with Care Management/Social Workerr. Management plans discussed with the patient, family and they are in agreement.  CODE STATUS: full code  TOTAL TIME TAKING CARE OF THIS PATIENT: 35 minutes.   sign off  Ramonita LabGouru, Fredis Malkiewicz M.D on 08/15/2017 at 1:02 PM  Between 7am to 6pm - Pager - [phone]  After 6pm go to [email or web address] - password [email or web address] [facility]  [facility]  Office  [phone]  CC: Primary care physician;  Curtis SitesKlein, Bert J III,  MD   Note: This dictation was prepared with Dragon dictation along with smaller phrase technology. Any transcriptional errors that result from this process are unintentional.

## 2017-08-15 NOTE — Care Management (Addendum)
Case dicussed with Janell QuietLance McGee, PA. He would like patient to be discharged home on oxygen. Spoke with primary nurse and he reports PT worked with patient this am and she was on RA and stayed above 90%. She is currently on room air at rest. Does not have qualifying home O2 per sats.  Patient to dishcarge home with home PT.

## 2017-08-15 NOTE — Care Management Note (Signed)
Case Management Note  Patient Details  Name: Cassell Smilesatricia L Asher MRN: [id number] Date of Birth: 05/04/1951  Subjective/Objective:  Discharging today                Action/Plan: Advanced notified of discharge.   Expected Discharge Date:  08/15/17               Expected Discharge Plan:  Home w Home Health Services  In-House Referral:     Discharge planning Services  CM Consult  Post Acute Care Choice:  Home Health Choice offered to:  Patient  DME Arranged:    DME Agency:     HH Arranged:  PT HH Agency:  [facility]  Status of Service:  Completed, signed off  If discussed at Long Length of Stay Meetings, dates discussed:    Additional Comments:  Marily MemosLisa M Vinessa Macconnell, RN 08/15/2017, 1:22 PM

## 2017-08-15 NOTE — Progress Notes (Signed)
Inpatient Diabetes Program Recommendations  AACE/ADA: New Consensus Statement on Inpatient Glycemic Control (2015)  Target Ranges:  Prepandial:   less than 140 mg/dL      Peak postprandial:   less than 180 mg/dL (1-2 hours)      Critically ill patients:  140 - 180 mg/dL   Results for Valerie Solis, Valerie Solis (MRN [id number]) as of 08/15/2017 11:15  Ref. Range 08/14/2017 08:31 08/14/2017 11:58 08/14/2017 17:01 08/14/2017 21:09 08/15/2017 07:52  Glucose-Capillary Latest Ref Range: 65 - 99 mg/dL 409185 (H) 811184 (H) 914341 (H) 361 (H) 154 (H)   Review of Glycemic Control  Diabetes history: DM2 Outpatient Diabetes medications: Metformin 500 mg BID Current orders for Inpatient glycemic control: Novolog 0-15 units TID with meals  Inpatient Diabetes Program Recommendations: Correction (SSI): Please consider ordering Novolog 0-5 units QHS for bedtime correction. Insulin - Meal Coverage: If steroids are continued, please consider ordering Novolog 4 units TID with meals for meal coverage if patient eats at least 50% of meal. HgbA1C: Please consider ordering an A1C to evaluate glycemic control over the past 2-3 months.  Thanks, Orlando PennerMarie Aubreana Cornacchia, RN, MSN, CDE Diabetes Coordinator Inpatient Diabetes Program [phone] (Team Pager from 8am to 5pm)

## 2017-08-15 NOTE — Progress Notes (Signed)
Physical Therapy Treatment Patient Details Name: Valerie Solis MRN: [id number] DOB: 1951-09-19 Today's Date: 08/15/2017    History of Present Illness Pt is a 66 y.o. 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  Of note, pt's SpO2 readings down in the 80s during hospital stay and chest x ray confirming negative for PE but positive for PNA.  Pt is now being treated for PNA.  PMH includes htn, sleep apnea, anxiety, DM type 2, lumbar and cervical surgery, h/o IBS.    PT Comments    Pt able to ambulate 160 feet with RW modified independent and navigate 4 stairs safely with B railings.  Pt also modified independent with transfers and supine to sit.  Pt's O2 sats 92-94% on room air with all functional mobility (except 91% after navigating stairs).  Pain 5/10 L knee during session's activities (nursing gave pt pain meds end of session).  Overall pt tolerated session well and pt's L knee flexion ROM 93 degrees in sitting.  Pt appears safe to discharge home with HHPT when medically appropriate.    Follow Up Recommendations  Home health PT     Equipment Recommendations  Rolling walker with 5" wheels    Recommendations for Other Services       Precautions / Restrictions Precautions Precautions: Fall;Knee Precaution Booklet Issued: Yes (comment) Restrictions Weight Bearing Restrictions: Yes LLE Weight Bearing: Weight bearing as tolerated    Mobility  Bed Mobility Overal bed mobility: Modified Independent Bed Mobility: Supine to Sit     Supine to sit: Modified independent (Device/Increase time)     General bed mobility comments: Slightly increased time but no physical assist or cues needed.   Transfers Overall transfer level: Modified independent Equipment used: Rolling walker (2 wheeled) Transfers: Sit to/from Stand Sit to Stand: Modified independent (Device/Increase time) Stand pivot transfers: Modified independent (Device/Increase time) (transfer to [facility] (over toilet in  bathroom))       General transfer comment: Steady safe transfers; no vc's required for technique  Ambulation/Gait Ambulation/Gait assistance: Modified independent (Device/Increase time) Ambulation Distance (Feet):  (160 feet x2) Assistive device: Rolling walker (2 wheeled)   Gait velocity: decreased but steady   General Gait Details: step to/partial step through pattern; decreased stance time L LE; mild antalgic gait; good heelstrike; mild L knee flexion during L LE swing phase   Stairs Stairs: Yes  Assist: SBA Stair Management: Two rails;Step to pattern;Forwards Number of Stairs: 4 General stair comments: no vc's for technique required; steady and safe  Wheelchair Mobility    Modified Rankin (Stroke Patients Only)       Balance Overall balance assessment: Modified Independent Sitting-balance support: No upper extremity supported Sitting balance-Leahy Scale: Normal Sitting balance - Comments: sitting reaching outside BOS   Standing balance support: No upper extremity supported Standing balance-Leahy Scale: Good Standing balance comment: standing reaching outside BOS (washing hands at sink and reaching for towels and throwing towel away)                            Cognition Arousal/Alertness: Awake/alert Behavior During Therapy: WFL for tasks assessed/performed Overall Cognitive Status: Within Functional Limits for tasks assessed                                        Exercises Total Joint Exercises Ankle Circles/Pumps: AROM;Strengthening;Both;10 reps;Supine Quad Sets: AROM;Strengthening;Both;10 reps;Supine  Short Arc Quad: AROM;Strengthening;Left;10 reps;Supine Heel Slides: AAROM;Strengthening;Left;10 reps;Supine Hip ABduction/ADduction: AROM;Strengthening;Left;10 reps;Supine Straight Leg Raises: AROM;Strengthening;Left;10 reps;Supine Goniometric ROM: L knee extension 5 degrees short of neutral semi-supine in recliner; L knee flexion  93 degrees sitting in recliner    General Comments General comments (skin integrity, edema, etc.): Dried drainage noted L knee dressing.  Nursing cleared pt for participation in physical therapy and to trial pt on room air (assess pt's O2 needs for discharge).  Pt agreeable to PT session.      Pertinent Vitals/Pain Pain Assessment: 0-10 Pain Score: 5  Pain Location: L knee Pain Descriptors / Indicators: Sore;Tender;Operative site guarding Pain Intervention(s): Limited activity within patient's tolerance;Monitored during session;Repositioned;Patient requesting pain meds-RN notified;Ice applied    Home Living                      Prior Function            PT Goals (current goals can now be found in the care plan section) Acute Rehab PT Goals Patient Stated Goal: to go home PT Goal Formulation: With patient Time For Goal Achievement: 08/25/17 Potential to Achieve Goals: Good Additional Goals Additional Goal #1: Pt's L knee ROM 0-90 degrees. Progress towards PT goals: Progressing toward goals    Frequency    BID      PT Plan Current plan remains appropriate    Co-evaluation              AM-PAC PT "6 Clicks" Daily Activity  Outcome Measure  Difficulty turning over in bed (including adjusting bedclothes, sheets and blankets)?: None Difficulty moving from lying on back to sitting on the side of the bed? : None Difficulty sitting down on and standing up from a chair with arms (e.g., wheelchair, bedside commode, etc,.)?: None Help needed moving to and from a bed to chair (including a wheelchair)?: None Help needed walking in hospital room?: None Help needed climbing 3-5 steps with a railing? : A Little 6 Click Score: 23    End of Session Equipment Utilized During Treatment: Gait belt Activity Tolerance: Patient tolerated treatment well Patient left: in chair;with call bell/phone within reach;with chair alarm set;with family/visitor present;with SCD's  reapplied (B heels elevated via towel rolls; polar care in place and activated) Nurse Communication: Mobility status;Precautions;Weight bearing status;Patient requests pain meds PT Visit Diagnosis: Other abnormalities of gait and mobility (R26.89);Muscle weakness (generalized) (M62.81);Pain Pain - Right/Left: Left Pain - part of body: Knee     Time: [id number] PT Time Calculation (min) (ACUTE ONLY): 44 min  Charges:  $Gait Training: 8-22 mins $Therapeutic Exercise: 8-22 mins $Therapeutic Activity: 8-22 mins                    G CodesHendricks Limes:      Sohrab Keelan, PT 08/15/17, 11:07 AM [phone]

## 2017-08-15 NOTE — Discharge Planning (Signed)
Patient IV removed.  RN assessment and VS revealed stability for DC with HH.  Discharge papers given, explained and educated.  Informed of suggested FU appt and appt made. Signed scripts printed and also given.  Patient successfully weaned to room air and therefore not needing O2 for discharge.  Once ready, will be wheeled to front and family transporting home via car.

## 2017-08-15 NOTE — Discharge Planning (Signed)
PT assessed O2 status with activity. On RA with activity, patient maintained between 91-94%. Currently, will not require O2 for discharge.

## 2017-08-15 NOTE — Progress Notes (Signed)
Subjective: 4 Days Post-Op Procedure(s) (LRB): TOTAL KNEE ARTHROPLASTY (Left) Patient reports pain as mild. Pain is improving.   Patient is being treated for PNA. Oxygen saturations improving, currently down to 1 L via [place]. CTA chest negative for PE.  Denies any CP, SOB, ABD pain. We will continue therapy today.   Objective: Vital signs in last 24 hours: Temp:  [98.1 F (36.7 C)] 98.1 F (36.7 C) (10/21 1521) Pulse Rate:  [84-89] 84 (10/21 2111) Resp:  [18] 18 (10/21 2111) BP: (130-137)/(60-73) 130/73 (10/21 2111) SpO2:  [93 %-94 %] 93 % (10/21 2111)  Intake/Output from previous day: 10/21 0701 - 10/22 0700 In: 630 [P.O.:480; IV Piggyback:150] Out: -  Intake/Output this shift: No intake/output data recorded.   Recent Labs  08/13/17 0354  HGB 12.1    Recent Labs  08/13/17 0354  WBC 10.6  RBC 3.72*  HCT 34.1*  PLT 162    Recent Labs  08/13/17 0354 08/14/17 0342  NA 132* 132*  K 4.4 4.8  CL 96* 97*  CO2 27 27  BUN 10 16  CREATININE 0.68 0.75  GLUCOSE 167* 231*  CALCIUM 8.8* 9.7   No results for input(s): LABPT, INR in the last 72 hours.  EXAM General - Patient is Alert, Appropriate and Oriented  Lungs - CTA no wheezing. Good air movement bilaterally Extremity - Neurovascular intact Sensation intact distally Intact pulses distally Dorsiflexion/Plantar flexion intact No cellulitis present Compartment soft Dressing - dressing C/D/I and scant drainage Motor Function - intact, moving foot and toes well on exam.   Past Medical History:  Diagnosis Date  . Anxiety   . Arthritis   . GERD (gastroesophageal reflux disease)   . Hypercholesterolemia   . Hypertension   . IBS (irritable bowel syndrome)   . Sleep apnea    OSA--USE C-PAP  . Thyroid disease     Assessment/Plan:   4 Days Post-Op Procedure(s) (LRB): TOTAL KNEE ARTHROPLASTY (Left) Active Problems:   Status post total knee replacement using cement, left  Estimated body mass index is 35.35  kg/m as calculated from the following:   Height as of this encounter: 5\' 6"  (1.676 m).   Weight as of this encounter: 99.3 kg (219 lb). Advance diet Up with therapy   Labs stable Encouraged incentive spirometer Currently on IV Levaquin, will transition to oral levaquin upon discharge.  Continue to wean off O2. Plan on discharge to home today with HHPT  DVT Prophylaxis - Lovenox, Foot Pumps and TED hose Weight-Bearing as tolerated to left leg  J. Horris LatinoLance Donna Snooks, PA-C [facility] 08/15/2017, 12:04 PM

## 2017-08-26 ENCOUNTER — Ambulatory Visit
Admission: RE | Admit: 2017-08-26 | Discharge: 2017-08-26 | Disposition: A | Discharge status: Home / Self Care | Payer: Medicare Other | Source: Ambulatory Visit | Attending: Internal Medicine | Admitting: Internal Medicine

## 2017-08-26 ENCOUNTER — Other Ambulatory Visit: Payer: Self-pay | Admitting: Internal Medicine

## 2017-08-26 DIAGNOSIS — R609 Edema, unspecified: Secondary | ICD-10-CM

## 2017-08-26 DIAGNOSIS — R600 Localized edema: Secondary | ICD-10-CM | POA: Diagnosis present

## 2018-10-05 ENCOUNTER — Ambulatory Visit
Admission: RE | Admit: 2018-10-05 | Discharge: 2018-10-05 | Disposition: A | Discharge status: Home / Self Care | Payer: Medicare Other | Source: Ambulatory Visit | Attending: Family Medicine | Admitting: Family Medicine

## 2018-10-05 ENCOUNTER — Other Ambulatory Visit: Payer: Self-pay | Admitting: Family Medicine

## 2018-10-05 DIAGNOSIS — R221 Localized swelling, mass and lump, neck: Secondary | ICD-10-CM

## 2018-10-10 ENCOUNTER — Ambulatory Visit: Payer: Medicare Other

## 2018-11-01 ENCOUNTER — Other Ambulatory Visit: Payer: Self-pay | Admitting: Internal Medicine

## 2018-11-01 DIAGNOSIS — Z1231 Encounter for screening mammogram for malignant neoplasm of breast: Secondary | ICD-10-CM

## 2018-12-06 ENCOUNTER — Ambulatory Visit
Admission: RE | Admit: 2018-12-06 | Discharge: 2018-12-06 | Disposition: A | Discharge status: Home / Self Care | Payer: Medicare Other | Source: Ambulatory Visit | Attending: Internal Medicine | Admitting: Internal Medicine

## 2018-12-06 DIAGNOSIS — Z1231 Encounter for screening mammogram for malignant neoplasm of breast: Secondary | ICD-10-CM | POA: Diagnosis not present

## 2019-02-18 IMAGING — MR MR KNEE*L* W/O CM
6 series · 36 of 40 positions shown · non-contrast
Comparison: None.

CLINICAL DATA: Left knee pain.  Osteoarthritis.

EXAM:
MRI OF THE LEFT KNEE WITHOUT CONTRAST
TECHNIQUE: Multiplanar, multisequence MR imaging of the knee was performed. No
intravenous contrast was administered.

[Series 3: PD fat-sat · axial · 3.0mm · 0.50mm/px · z∈[-95,+41]mm · 8 of 42 slices shown (1 of 4)]
[im 1/42]
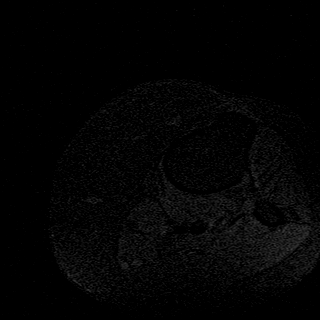
[im 6/42]
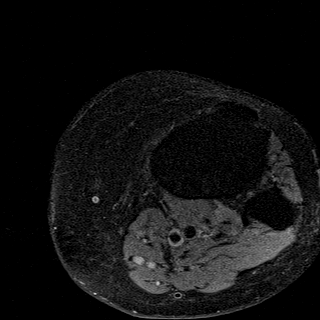
[im 12/42]
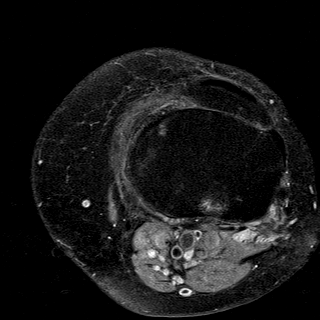
[im 18/42]
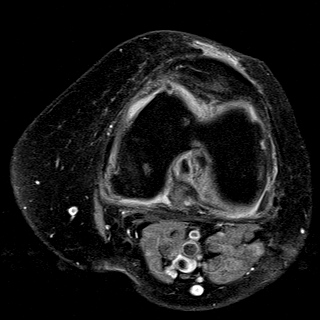
[im 24/42]
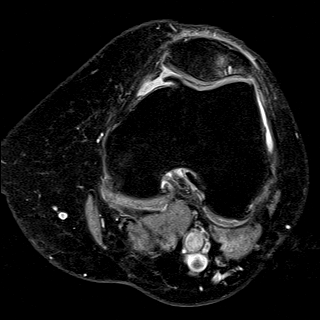
[im 30/42]
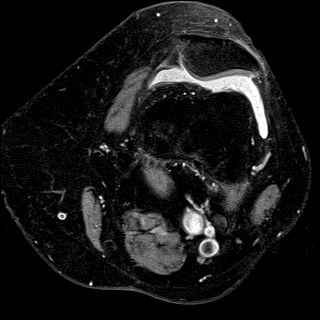
[im 36/42]
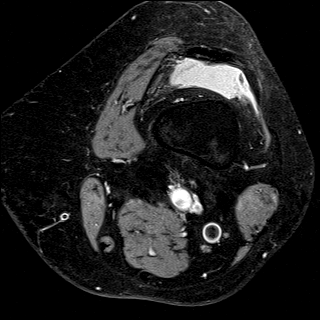
[im 42/42]
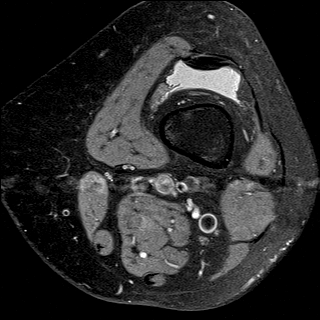

[Series 4: T1 · coronal · 3.0mm · 0.50mm/px · 3 of 33 slices shown]
[im 1/33]
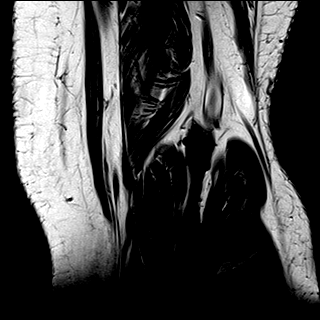
[im 6/33]
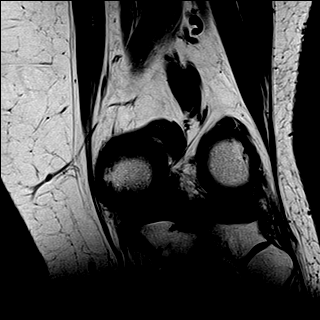
[im 11/33]
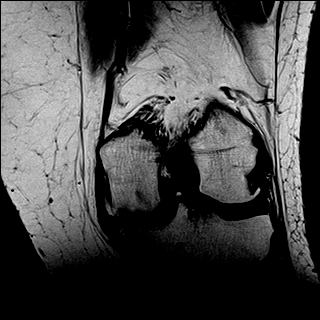

[Series 5: T2 fat-sat · coronal · 3.0mm · 0.31mm/px · 7 of 32 slices shown]
[im 1/32]
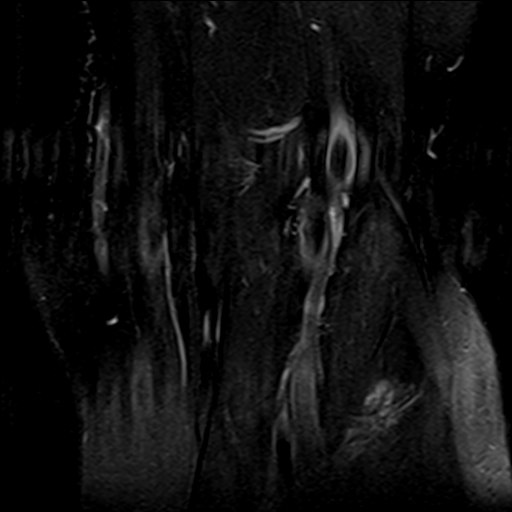
[im 6/32]
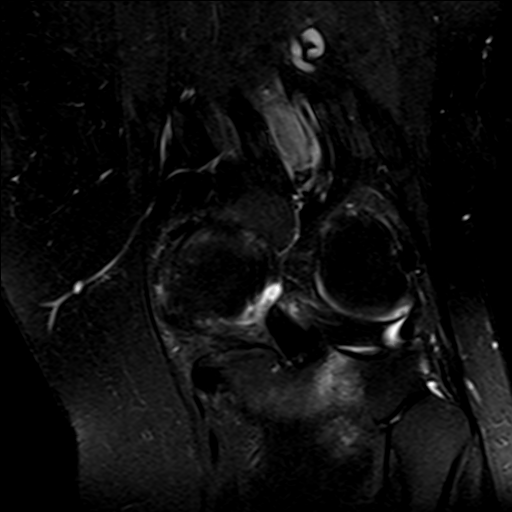
[im 11/32]
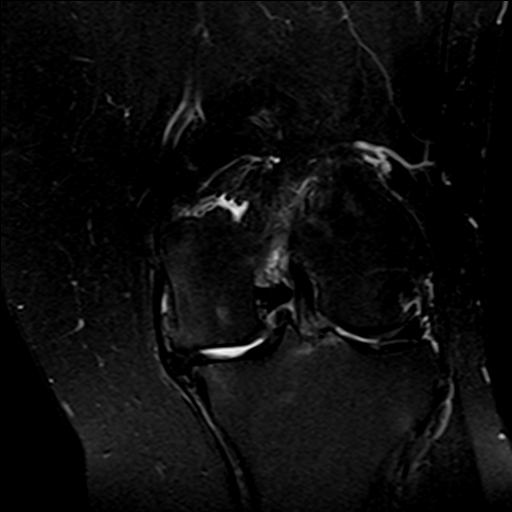
[im 16/32]
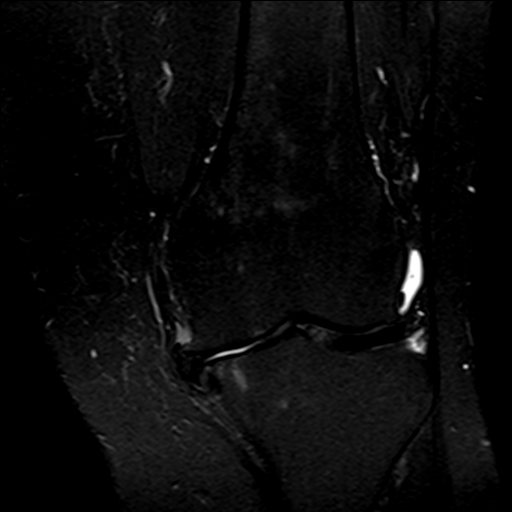
[im 21/32]
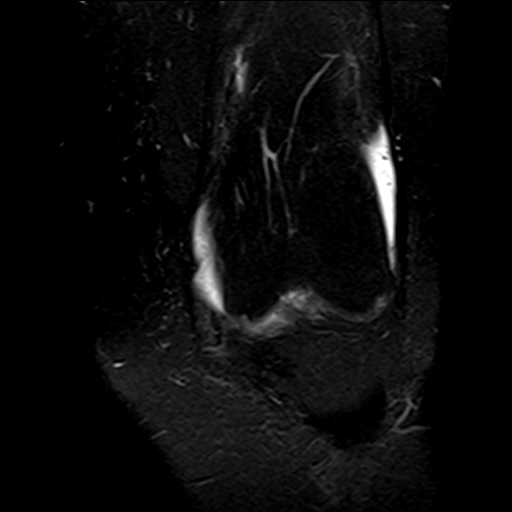
[im 26/32]
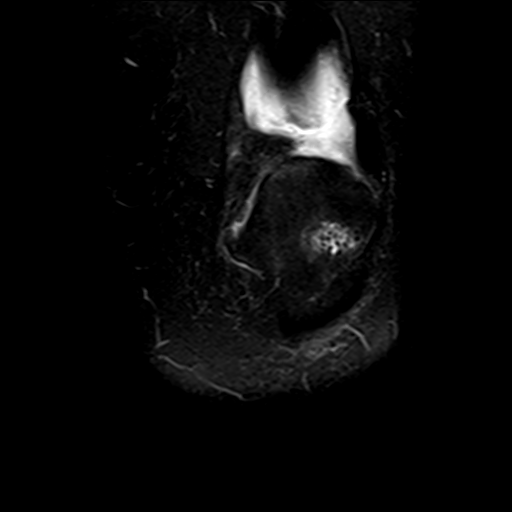
[im 32/32]
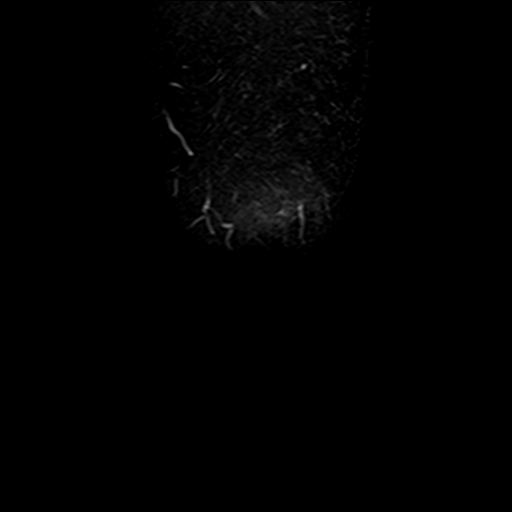

[Series 6: PD fat-sat · coronal · 3.0mm · 0.50mm/px · 7 of 33 slices shown (2 of 4)]
[im 1/33]
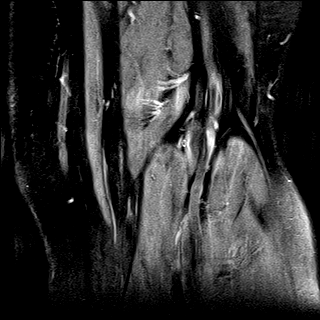
[im 6/33]
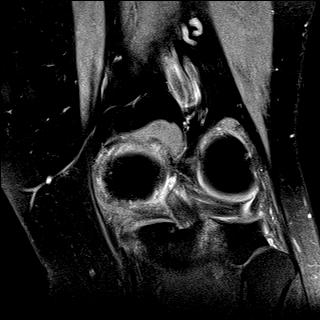
[im 11/33]
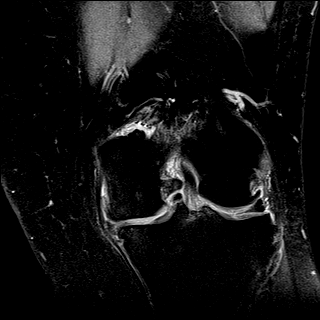
[im 17/33]
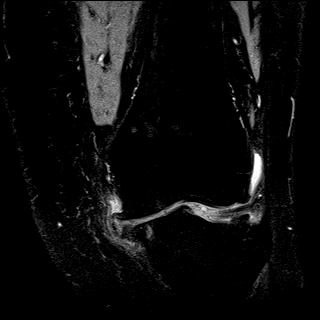
[im 22/33]
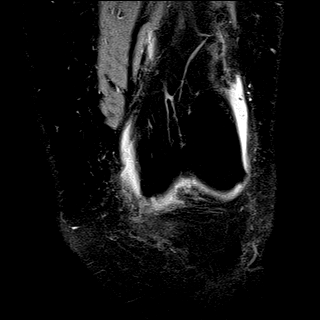
[im 27/33]
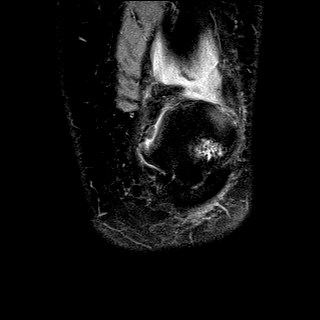
[im 33/33]
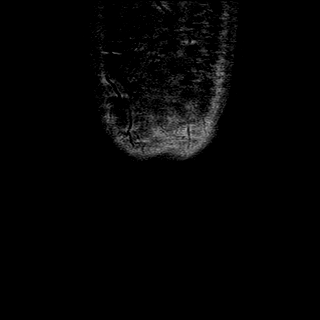

[Series 7: PD fat-sat · sagittal · 3.0mm · 0.50mm/px · 7 of 34 slices shown (3 of 4)]
[im 1/34]
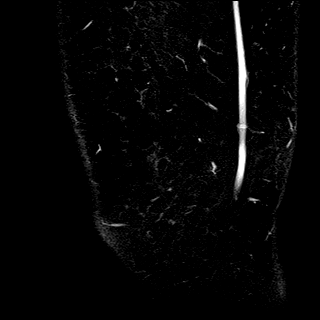
[im 6/34]
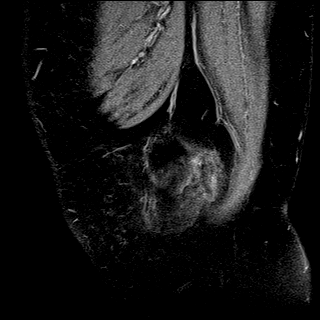
[im 12/34]
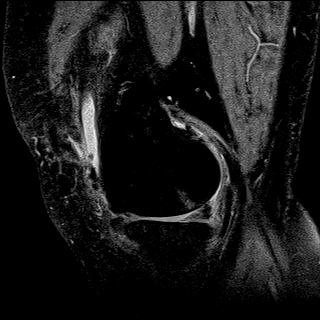
[im 17/34]
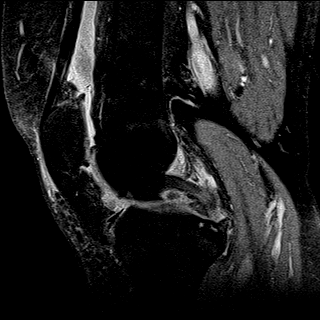
[im 23/34]
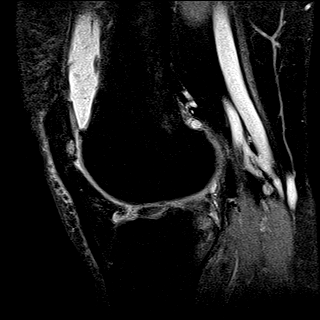
[im 28/34]
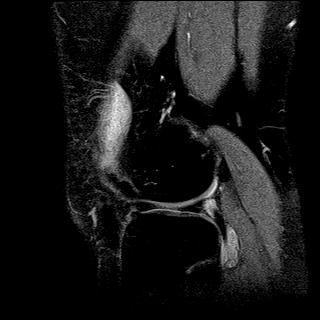
[im 34/34]
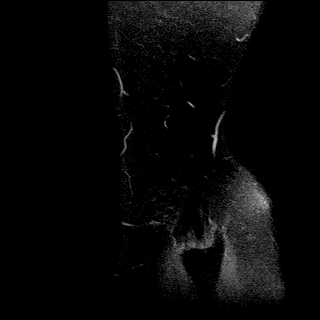

[Series 8: PD fat-sat · oblique · 2.0mm · 0.62mm/px · 4 of 18 slices shown (4 of 4)]
[im 1/18]
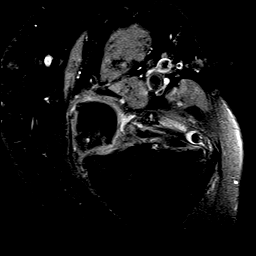
[im 6/18]
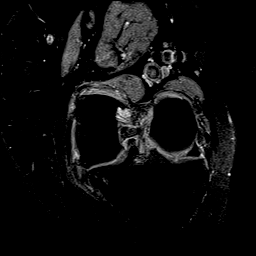
[im 12/18]
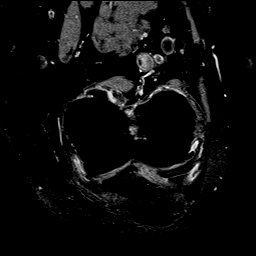
[im 18/18]
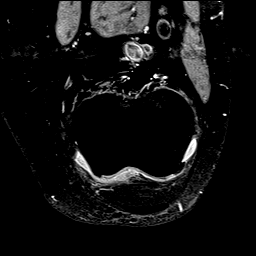

[36 of 40 positions shown; findings below may reference images not displayed]

FINDINGS: MENISCI

Medial meniscus: Radial tear of the body of the medial meniscus
extending into the posterior horn.

Lateral meniscus: Tiny radial tear of the free edge of the body of
the lateral meniscus.

LIGAMENTS

Cruciates:  Intact ACL and PCL.

Collaterals: Medial collateral ligament is intact. Lateral
collateral ligament complex is intact.

CARTILAGE

Patellofemoral: High-grade partial-thickness cartilage loss of the
lateral patellar facet with areas of full-thickness cartilage loss
and subchondral cystic changes. Partial-thickness cartilage loss
throughout the remainder of the patellofemoral compartment.

Medial: Full-thickness cartilage loss of the medial femoral condyle
and medial tibial plateau with marginal osteophytes. Mild
subchondral reactive marrow changes.

Lateral: Partial-thickness cartilage loss of the lateral femoral
condyle and lateral tibial plateau primarily along the medial
aspect.

Joint: Large joint effusion. Normal Hoffa's fat. No plical
thickening.

Popliteal Fossa:  No Baker cyst. Intact popliteus tendon.

Extensor Mechanism: Intact quadriceps tendon and patellar tendon.
Intact medial and lateral patellar retinaculum. Intact MPFL.

Bones:  No acute osseous abnormality.  No aggressive osseous lesion.

Other: No fluid collection or hematoma.
IMPRESSION: 1. Tricompartmental cartilage abnormalities most severe in the
medial femorotibial compartment as described above. Findings are
most consistent with osteoarthritis.
2. Radial tear of the body of the medial meniscus extending into the
posterior horn.
3. Tiny radial tear of the free edge of the body of the lateral
meniscus.

## 2019-05-16 ENCOUNTER — Other Ambulatory Visit: Payer: Self-pay | Admitting: Unknown Physician Specialty

## 2019-05-16 DIAGNOSIS — R221 Localized swelling, mass and lump, neck: Secondary | ICD-10-CM

## 2019-05-22 ENCOUNTER — Ambulatory Visit
Admission: RE | Admit: 2019-05-22 | Discharge: 2019-05-22 | Disposition: A | Discharge status: Home / Self Care | Payer: Medicare Other | Source: Ambulatory Visit | Attending: Unknown Physician Specialty | Admitting: Unknown Physician Specialty

## 2019-05-22 ENCOUNTER — Other Ambulatory Visit: Payer: Self-pay

## 2019-05-22 DIAGNOSIS — R221 Localized swelling, mass and lump, neck: Secondary | ICD-10-CM | POA: Insufficient documentation

## 2019-05-22 MED ORDER — IOHEXOL 300 MG/ML  SOLN
75.0000 mL | Freq: Once | INTRAMUSCULAR | Status: AC | PRN
  Administered 2019-05-22: 75 mL via INTRAVENOUS

## 2019-10-30 ENCOUNTER — Other Ambulatory Visit: Payer: Self-pay | Admitting: Internal Medicine

## 2019-10-30 DIAGNOSIS — Z1231 Encounter for screening mammogram for malignant neoplasm of breast: Secondary | ICD-10-CM

## 2019-12-10 ENCOUNTER — Ambulatory Visit
Admission: RE | Admit: 2019-12-10 | Discharge: 2019-12-10 | Disposition: A | Discharge status: Home / Self Care | Payer: Medicare PPO | Source: Ambulatory Visit | Attending: Internal Medicine | Admitting: Internal Medicine

## 2019-12-10 DIAGNOSIS — Z1231 Encounter for screening mammogram for malignant neoplasm of breast: Secondary | ICD-10-CM | POA: Insufficient documentation

## 2020-11-10 ENCOUNTER — Other Ambulatory Visit: Payer: Self-pay | Admitting: Internal Medicine

## 2020-11-10 DIAGNOSIS — Z1231 Encounter for screening mammogram for malignant neoplasm of breast: Secondary | ICD-10-CM

## 2020-12-16 ENCOUNTER — Other Ambulatory Visit: Payer: Self-pay

## 2020-12-16 ENCOUNTER — Ambulatory Visit
Admission: RE | Admit: 2020-12-16 | Discharge: 2020-12-16 | Disposition: A | Discharge status: Home / Self Care | Payer: Medicare PPO | Source: Ambulatory Visit | Attending: Internal Medicine | Admitting: Internal Medicine

## 2020-12-16 DIAGNOSIS — Z1231 Encounter for screening mammogram for malignant neoplasm of breast: Secondary | ICD-10-CM | POA: Diagnosis present

## 2021-12-07 ENCOUNTER — Other Ambulatory Visit: Payer: Self-pay | Admitting: Internal Medicine

## 2021-12-07 DIAGNOSIS — Z1231 Encounter for screening mammogram for malignant neoplasm of breast: Secondary | ICD-10-CM

## 2022-02-16 ENCOUNTER — Ambulatory Visit
Admission: RE | Admit: 2022-02-16 | Discharge: 2022-02-16 | Disposition: A | Discharge status: Home / Self Care | Payer: Medicare PPO | Source: Ambulatory Visit | Attending: Internal Medicine | Admitting: Internal Medicine

## 2022-02-16 DIAGNOSIS — Z1231 Encounter for screening mammogram for malignant neoplasm of breast: Secondary | ICD-10-CM | POA: Diagnosis present

## 2022-07-23 ENCOUNTER — Other Ambulatory Visit: Payer: Self-pay | Admitting: Neurology

## 2022-07-23 DIAGNOSIS — M542 Cervicalgia: Secondary | ICD-10-CM

## 2022-07-23 DIAGNOSIS — R200 Anesthesia of skin: Secondary | ICD-10-CM

## 2022-08-05 ENCOUNTER — Ambulatory Visit
Admission: RE | Admit: 2022-08-05 | Discharge: 2022-08-05 | Disposition: A | Discharge status: Home / Self Care | Payer: Medicare PPO | Source: Ambulatory Visit | Attending: Neurology | Admitting: Neurology

## 2022-08-05 DIAGNOSIS — M542 Cervicalgia: Secondary | ICD-10-CM

## 2022-08-05 DIAGNOSIS — R200 Anesthesia of skin: Secondary | ICD-10-CM

## 2023-05-02 ENCOUNTER — Other Ambulatory Visit: Payer: Self-pay | Admitting: Internal Medicine

## 2023-05-02 ENCOUNTER — Ambulatory Visit
Admission: RE | Admit: 2023-05-02 | Discharge: 2023-05-02 | Disposition: A | Discharge status: Home / Self Care | Payer: Medicare PPO | Source: Ambulatory Visit | Attending: Internal Medicine | Admitting: Internal Medicine

## 2023-05-02 ENCOUNTER — Other Ambulatory Visit: Payer: Self-pay

## 2023-05-02 DIAGNOSIS — R221 Localized swelling, mass and lump, neck: Secondary | ICD-10-CM

## 2023-05-02 LAB — POCT I-STAT CREATININE: Creatinine, Ser: 1 mg/dL (ref 0.44–1.00)

## 2023-05-02 MED ORDER — IOHEXOL 300 MG/ML  SOLN
75.0000 mL | Freq: Once | INTRAMUSCULAR | Status: AC | PRN
  Administered 2023-05-02: 75 mL via INTRAVENOUS

## 2023-05-03 ENCOUNTER — Encounter: Payer: Self-pay | Admitting: Internal Medicine

## 2023-05-03 DIAGNOSIS — R221 Localized swelling, mass and lump, neck: Secondary | ICD-10-CM

## 2023-07-20 ENCOUNTER — Other Ambulatory Visit: Payer: Self-pay | Admitting: Neurology

## 2023-07-20 DIAGNOSIS — M5416 Radiculopathy, lumbar region: Secondary | ICD-10-CM

## 2023-07-20 DIAGNOSIS — M545 Low back pain, unspecified: Secondary | ICD-10-CM

## 2023-08-03 ENCOUNTER — Encounter: Payer: Self-pay | Admitting: Neurology

## 2023-08-06 ENCOUNTER — Ambulatory Visit
Admission: RE | Admit: 2023-08-06 | Discharge: 2023-08-06 | Disposition: A | Discharge status: Home / Self Care | Payer: Medicare PPO | Source: Ambulatory Visit | Attending: Neurology | Admitting: Neurology

## 2023-08-06 DIAGNOSIS — M545 Low back pain, unspecified: Secondary | ICD-10-CM

## 2023-08-06 DIAGNOSIS — M5416 Radiculopathy, lumbar region: Secondary | ICD-10-CM

## 2024-05-04 ENCOUNTER — Other Ambulatory Visit: Payer: Self-pay | Admitting: Otolaryngology

## 2024-05-04 ENCOUNTER — Encounter: Payer: Self-pay | Admitting: Otolaryngology

## 2024-05-04 DIAGNOSIS — K1120 Sialoadenitis, unspecified: Secondary | ICD-10-CM

## 2024-05-09 ENCOUNTER — Ambulatory Visit
Admission: RE | Admit: 2024-05-09 | Discharge: 2024-05-09 | Disposition: A | Discharge status: Home / Self Care | Source: Ambulatory Visit | Attending: Otolaryngology | Admitting: Otolaryngology

## 2024-05-09 DIAGNOSIS — K1120 Sialoadenitis, unspecified: Secondary | ICD-10-CM

## 2024-05-09 MED ORDER — IOPAMIDOL (ISOVUE-300) INJECTION 61%
75.0000 mL | Freq: Once | INTRAVENOUS | Status: AC | PRN
  Administered 2024-05-09: 75 mL via INTRAVENOUS

## 2024-08-03 ENCOUNTER — Encounter

## 2024-08-31 ENCOUNTER — Encounter

## 2024-09-17 ENCOUNTER — Ambulatory Visit

## 2024-09-17 DIAGNOSIS — Z860101 Personal history of adenomatous and serrated colon polyps: Secondary | ICD-10-CM | POA: Diagnosis not present

## 2024-09-17 DIAGNOSIS — Z09 Encounter for follow-up examination after completed treatment for conditions other than malignant neoplasm: Secondary | ICD-10-CM | POA: Diagnosis present

## 2024-09-17 DIAGNOSIS — K5730 Diverticulosis of large intestine without perforation or abscess without bleeding: Secondary | ICD-10-CM | POA: Diagnosis not present

## 2024-09-17 DIAGNOSIS — K219 Gastro-esophageal reflux disease without esophagitis: Secondary | ICD-10-CM | POA: Diagnosis not present

## 2024-09-17 DIAGNOSIS — R1319 Other dysphagia: Secondary | ICD-10-CM | POA: Diagnosis not present
